# Patient Record
Sex: Female | Born: 1950 | Race: White | Hispanic: No | Marital: Married | Smoking: Never smoker
Health system: Southern US, Community
[De-identification: ages and names within clinical notes are randomized; demographics above are authoritative.]

## PROBLEM LIST (undated history)

## (undated) DIAGNOSIS — C801 Malignant (primary) neoplasm, unspecified: Secondary | ICD-10-CM

## (undated) DIAGNOSIS — M4802 Spinal stenosis, cervical region: Secondary | ICD-10-CM

## (undated) DIAGNOSIS — F419 Anxiety disorder, unspecified: Secondary | ICD-10-CM

## (undated) DIAGNOSIS — K219 Gastro-esophageal reflux disease without esophagitis: Secondary | ICD-10-CM

## (undated) DIAGNOSIS — E119 Type 2 diabetes mellitus without complications: Secondary | ICD-10-CM

## (undated) DIAGNOSIS — M1990 Unspecified osteoarthritis, unspecified site: Secondary | ICD-10-CM

## (undated) DIAGNOSIS — Z8489 Family history of other specified conditions: Secondary | ICD-10-CM

## (undated) DIAGNOSIS — Z9889 Other specified postprocedural states: Secondary | ICD-10-CM

## (undated) DIAGNOSIS — R112 Nausea with vomiting, unspecified: Secondary | ICD-10-CM

## (undated) DIAGNOSIS — K146 Glossodynia: Secondary | ICD-10-CM

## (undated) DIAGNOSIS — N9489 Other specified conditions associated with female genital organs and menstrual cycle: Secondary | ICD-10-CM

## (undated) DIAGNOSIS — F32A Depression, unspecified: Secondary | ICD-10-CM

## (undated) DIAGNOSIS — I10 Essential (primary) hypertension: Secondary | ICD-10-CM

## (undated) HISTORY — PX: TONSILLECTOMY: SUR1361

## (undated) HISTORY — PX: JOINT REPLACEMENT: SHX530

## (undated) HISTORY — PX: DILATION AND CURETTAGE OF UTERUS: SHX78

---
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
## 1991-12-18 HISTORY — PX: BREAST EXCISIONAL BIOPSY: SUR124

## 2000-12-17 HISTORY — PX: NECK SURGERY: SHX720

## 2004-05-04 ENCOUNTER — Other Ambulatory Visit: Payer: Self-pay

## 2004-12-19 ENCOUNTER — Ambulatory Visit: Payer: Self-pay | Admitting: Gastroenterology

## 2005-05-29 ENCOUNTER — Ambulatory Visit: Payer: Self-pay | Admitting: Psychiatry

## 2005-06-28 ENCOUNTER — Ambulatory Visit: Payer: Self-pay | Admitting: Internal Medicine

## 2006-06-11 ENCOUNTER — Ambulatory Visit: Payer: Self-pay | Admitting: Internal Medicine

## 2006-06-16 ENCOUNTER — Ambulatory Visit: Payer: Self-pay | Admitting: Internal Medicine

## 2006-07-11 ENCOUNTER — Ambulatory Visit: Payer: Self-pay | Admitting: Internal Medicine

## 2006-07-17 ENCOUNTER — Ambulatory Visit: Payer: Self-pay | Admitting: Internal Medicine

## 2007-07-29 ENCOUNTER — Ambulatory Visit: Payer: Self-pay | Admitting: Internal Medicine

## 2008-08-24 ENCOUNTER — Ambulatory Visit: Payer: Self-pay | Admitting: Internal Medicine

## 2009-08-31 ENCOUNTER — Ambulatory Visit: Payer: Self-pay | Admitting: Internal Medicine

## 2010-09-20 ENCOUNTER — Ambulatory Visit: Payer: Self-pay | Admitting: Internal Medicine

## 2010-12-17 HISTORY — PX: COLONOSCOPY: SHX174

## 2010-12-17 HISTORY — PX: CHOLECYSTECTOMY: SHX55

## 2011-03-27 ENCOUNTER — Ambulatory Visit: Payer: Self-pay | Admitting: Obstetrics and Gynecology

## 2011-03-27 DIAGNOSIS — I10 Essential (primary) hypertension: Secondary | ICD-10-CM

## 2011-03-30 ENCOUNTER — Ambulatory Visit: Payer: Self-pay | Admitting: Obstetrics and Gynecology

## 2011-04-02 LAB — PATHOLOGY REPORT

## 2011-04-11 ENCOUNTER — Ambulatory Visit: Payer: Self-pay | Admitting: Internal Medicine

## 2011-04-24 ENCOUNTER — Ambulatory Visit: Payer: Self-pay | Admitting: Gastroenterology

## 2011-05-07 ENCOUNTER — Ambulatory Visit: Payer: Self-pay | Admitting: Gastroenterology

## 2011-05-28 ENCOUNTER — Ambulatory Visit: Payer: Self-pay | Admitting: Gastroenterology

## 2011-06-07 ENCOUNTER — Ambulatory Visit: Payer: Self-pay | Admitting: Surgery

## 2011-06-14 ENCOUNTER — Ambulatory Visit: Payer: Self-pay | Admitting: Surgery

## 2012-01-01 ENCOUNTER — Ambulatory Visit: Payer: Self-pay | Admitting: Internal Medicine

## 2012-12-17 HISTORY — PX: BREAST BIOPSY: SHX20

## 2013-01-19 ENCOUNTER — Ambulatory Visit: Payer: Self-pay | Admitting: Internal Medicine

## 2013-01-27 ENCOUNTER — Ambulatory Visit: Payer: Self-pay | Admitting: Internal Medicine

## 2013-03-02 ENCOUNTER — Ambulatory Visit: Payer: Self-pay | Admitting: Surgery

## 2013-10-28 ENCOUNTER — Ambulatory Visit: Payer: Self-pay | Admitting: Surgery

## 2014-02-01 ENCOUNTER — Ambulatory Visit: Payer: Self-pay | Admitting: Surgery

## 2014-03-29 DIAGNOSIS — F418 Other specified anxiety disorders: Secondary | ICD-10-CM | POA: Insufficient documentation

## 2014-03-29 DIAGNOSIS — E6609 Other obesity due to excess calories: Secondary | ICD-10-CM | POA: Insufficient documentation

## 2014-03-29 DIAGNOSIS — I152 Hypertension secondary to endocrine disorders: Secondary | ICD-10-CM | POA: Insufficient documentation

## 2014-03-29 DIAGNOSIS — M4802 Spinal stenosis, cervical region: Secondary | ICD-10-CM | POA: Insufficient documentation

## 2014-03-29 DIAGNOSIS — E1159 Type 2 diabetes mellitus with other circulatory complications: Secondary | ICD-10-CM | POA: Insufficient documentation

## 2014-05-18 DIAGNOSIS — E1142 Type 2 diabetes mellitus with diabetic polyneuropathy: Secondary | ICD-10-CM | POA: Insufficient documentation

## 2015-11-30 ENCOUNTER — Other Ambulatory Visit: Payer: Self-pay | Admitting: Family Medicine

## 2015-11-30 DIAGNOSIS — Z0000 Encounter for general adult medical examination without abnormal findings: Secondary | ICD-10-CM

## 2016-01-18 ENCOUNTER — Ambulatory Visit
Admission: RE | Admit: 2016-01-18 | Discharge: 2016-01-18 | Disposition: A | Discharge status: Home / Self Care | Payer: BLUE CROSS/BLUE SHIELD | Source: Ambulatory Visit | Attending: Family Medicine | Admitting: Family Medicine

## 2016-01-18 DIAGNOSIS — Z1231 Encounter for screening mammogram for malignant neoplasm of breast: Secondary | ICD-10-CM | POA: Diagnosis present

## 2016-01-18 DIAGNOSIS — Z0000 Encounter for general adult medical examination without abnormal findings: Secondary | ICD-10-CM

## 2016-05-22 ENCOUNTER — Ambulatory Visit: Payer: BLUE CROSS/BLUE SHIELD | Admitting: Dietician

## 2016-06-15 ENCOUNTER — Other Ambulatory Visit: Payer: Self-pay | Admitting: Surgery

## 2016-06-15 DIAGNOSIS — N6002 Solitary cyst of left breast: Secondary | ICD-10-CM

## 2016-06-29 ENCOUNTER — Ambulatory Visit
Admission: RE | Admit: 2016-06-29 | Discharge: 2016-06-29 | Disposition: A | Discharge status: Home / Self Care | Payer: BLUE CROSS/BLUE SHIELD | Source: Ambulatory Visit | Attending: Surgery | Admitting: Surgery

## 2016-06-29 DIAGNOSIS — N6002 Solitary cyst of left breast: Secondary | ICD-10-CM | POA: Diagnosis present

## 2016-06-29 DIAGNOSIS — N63 Unspecified lump in breast: Secondary | ICD-10-CM | POA: Insufficient documentation

## 2016-07-05 ENCOUNTER — Other Ambulatory Visit: Payer: Self-pay | Admitting: Surgery

## 2016-07-05 DIAGNOSIS — N6320 Unspecified lump in the left breast, unspecified quadrant: Secondary | ICD-10-CM

## 2016-07-09 ENCOUNTER — Ambulatory Visit
Admission: RE | Admit: 2016-07-09 | Discharge: 2016-07-09 | Disposition: A | Discharge status: Home / Self Care | Payer: BLUE CROSS/BLUE SHIELD | Source: Ambulatory Visit | Attending: Surgery | Admitting: Surgery

## 2016-07-09 DIAGNOSIS — N63 Unspecified lump in breast: Secondary | ICD-10-CM | POA: Insufficient documentation

## 2016-07-09 DIAGNOSIS — D242 Benign neoplasm of left breast: Secondary | ICD-10-CM | POA: Insufficient documentation

## 2016-07-09 DIAGNOSIS — N6320 Unspecified lump in the left breast, unspecified quadrant: Secondary | ICD-10-CM

## 2016-07-09 HISTORY — PX: BREAST BIOPSY: SHX20

## 2016-07-10 LAB — SURGICAL PATHOLOGY

## 2016-07-12 ENCOUNTER — Other Ambulatory Visit: Payer: Self-pay | Admitting: Surgery

## 2016-07-12 DIAGNOSIS — N63 Unspecified lump in unspecified breast: Secondary | ICD-10-CM

## 2016-07-16 ENCOUNTER — Other Ambulatory Visit: Payer: Self-pay

## 2016-07-16 ENCOUNTER — Encounter
Admission: RE | Admit: 2016-07-16 | Discharge: 2016-07-16 | Disposition: A | Discharge status: Home / Self Care | Payer: BLUE CROSS/BLUE SHIELD | Source: Ambulatory Visit | Attending: Surgery | Admitting: Surgery

## 2016-07-16 DIAGNOSIS — Z01812 Encounter for preprocedural laboratory examination: Secondary | ICD-10-CM | POA: Diagnosis present

## 2016-07-16 DIAGNOSIS — Z01810 Encounter for preprocedural cardiovascular examination: Secondary | ICD-10-CM | POA: Insufficient documentation

## 2016-07-16 HISTORY — DX: Essential (primary) hypertension: I10

## 2016-07-16 HISTORY — DX: Nausea with vomiting, unspecified: R11.2

## 2016-07-16 HISTORY — DX: Type 2 diabetes mellitus without complications: E11.9

## 2016-07-16 HISTORY — DX: Anxiety disorder, unspecified: F41.9

## 2016-07-16 HISTORY — DX: Other specified postprocedural states: Z98.890

## 2016-07-16 HISTORY — DX: Family history of other specified conditions: Z84.89

## 2016-07-16 LAB — DIFFERENTIAL
BASOS ABS: 0 10*3/uL (ref 0–0.1)
BASOS PCT: 1 %
Eosinophils Absolute: 0.1 10*3/uL (ref 0–0.7)
Eosinophils Relative: 2 %
LYMPHS PCT: 28 %
Lymphs Abs: 1.9 10*3/uL (ref 1.0–3.6)
MONO ABS: 0.6 10*3/uL (ref 0.2–0.9)
Monocytes Relative: 9 %
NEUTROS ABS: 4.1 10*3/uL (ref 1.4–6.5)
NEUTROS PCT: 60 %

## 2016-07-16 LAB — CBC
HCT: 38.3 % (ref 35.0–47.0)
Hemoglobin: 13.4 g/dL (ref 12.0–16.0)
MCH: 30.5 pg (ref 26.0–34.0)
MCHC: 35 g/dL (ref 32.0–36.0)
MCV: 87 fL (ref 80.0–100.0)
PLATELETS: 297 10*3/uL (ref 150–440)
RBC: 4.4 MIL/uL (ref 3.80–5.20)
RDW: 13.8 % (ref 11.5–14.5)
WBC: 6.6 10*3/uL (ref 3.6–11.0)

## 2016-07-16 LAB — HEMOGLOBIN: HEMOGLOBIN: 13.5 g/dL (ref 12.0–16.0)

## 2016-07-16 NOTE — Patient Instructions (Signed)
Your procedure is scheduled on: Friday 07/20/16 Report to [facility].  Remember: Instructions that are not followed completely may result in serious medical risk, up to and including death, or upon the discretion of your surgeon and anesthesiologist your surgery may need to be rescheduled.    __X__ 1. Do not eat food or drink liquids after midnight. No gum chewing or hard candies.     __X__ 2. No Alcohol for 24 hours before or after surgery.   ____ 3. Bring all medications with you on the day of surgery if instructed.    __X__ 4. Notify your doctor if there is any change in your medical condition     (cold, fever, infections).     Do not wear jewelry, make-up, hairpins, clips or nail polish.  Do not wear lotions, powders, or perfumes.   Do not shave 48 hours prior to surgery. Men may shave face and neck.  Do not bring valuables to the hospital.    [facility] is not responsible for any belongings or valuables.               Contacts, dentures or bridgework may not be worn into surgery.  Leave your suitcase in the car. After surgery it may be brought to your room.  For patients admitted to the hospital, discharge time is determined by your                treatment team.   Patients discharged the day of surgery will not be allowed to drive home.   Please read over the following fact sheets that you were given:   Surgical Site Infection Prevention   __X__ Take these medicines the morning of surgery with A SIP OF WATER:    1. HYDRALAZINE  2. MAGNESIUM  3.   4.  5.  6.  ____ Fleet Enema (as directed)   __X__ Use CHG Soap as directed  ____ Use inhalers on the day of surgery  __X__ Stop metformin 2 days prior to surgery    __X__ Take 1/2 of usual insulin dose the night before surgery and none on the morning of surgery.   ____ Stop Coumadin/Plavix/aspirin on   __X__ Stop Anti-inflammatories on STOP ALEVE   __X__ Stop  supplements until after surgery. B12, FISH OIL   ____ Bring C-Pap to the hospital.

## 2016-07-17 NOTE — Pre-Procedure Instructions (Signed)
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

## 2016-07-20 ENCOUNTER — Ambulatory Visit
Admission: RE | Admit: 2016-07-20 | Discharge: 2016-07-20 | Disposition: A | Discharge status: Home / Self Care | Payer: BLUE CROSS/BLUE SHIELD | Source: Ambulatory Visit | Attending: Surgery | Admitting: Surgery

## 2016-07-20 ENCOUNTER — Encounter: Payer: Self-pay | Admitting: *Deleted

## 2016-07-20 ENCOUNTER — Ambulatory Visit: Payer: BLUE CROSS/BLUE SHIELD | Admitting: Certified Registered"

## 2016-07-20 ENCOUNTER — Encounter
Admission: RE | Disposition: A | Discharge status: Home / Self Care | Payer: Self-pay | Source: Ambulatory Visit | Attending: Surgery

## 2016-07-20 DIAGNOSIS — Z800 Family history of malignant neoplasm of digestive organs: Secondary | ICD-10-CM | POA: Diagnosis not present

## 2016-07-20 DIAGNOSIS — Z888 Allergy status to other drugs, medicaments and biological substances status: Secondary | ICD-10-CM | POA: Diagnosis not present

## 2016-07-20 DIAGNOSIS — Z9049 Acquired absence of other specified parts of digestive tract: Secondary | ICD-10-CM | POA: Diagnosis not present

## 2016-07-20 DIAGNOSIS — E119 Type 2 diabetes mellitus without complications: Secondary | ICD-10-CM | POA: Insufficient documentation

## 2016-07-20 DIAGNOSIS — D242 Benign neoplasm of left breast: Secondary | ICD-10-CM | POA: Diagnosis not present

## 2016-07-20 DIAGNOSIS — F418 Other specified anxiety disorders: Secondary | ICD-10-CM | POA: Insufficient documentation

## 2016-07-20 DIAGNOSIS — I10 Essential (primary) hypertension: Secondary | ICD-10-CM | POA: Insufficient documentation

## 2016-07-20 DIAGNOSIS — Z79899 Other long term (current) drug therapy: Secondary | ICD-10-CM | POA: Diagnosis not present

## 2016-07-20 DIAGNOSIS — Z881 Allergy status to other antibiotic agents status: Secondary | ICD-10-CM | POA: Insufficient documentation

## 2016-07-20 DIAGNOSIS — Z8249 Family history of ischemic heart disease and other diseases of the circulatory system: Secondary | ICD-10-CM | POA: Diagnosis not present

## 2016-07-20 DIAGNOSIS — Z833 Family history of diabetes mellitus: Secondary | ICD-10-CM | POA: Insufficient documentation

## 2016-07-20 DIAGNOSIS — E669 Obesity, unspecified: Secondary | ICD-10-CM | POA: Diagnosis not present

## 2016-07-20 DIAGNOSIS — N63 Unspecified lump in unspecified breast: Secondary | ICD-10-CM

## 2016-07-20 DIAGNOSIS — Z794 Long term (current) use of insulin: Secondary | ICD-10-CM | POA: Diagnosis not present

## 2016-07-20 DIAGNOSIS — Z9889 Other specified postprocedural states: Secondary | ICD-10-CM | POA: Diagnosis not present

## 2016-07-20 HISTORY — PX: BREAST LUMPECTOMY WITH NEEDLE LOCALIZATION: SHX5759

## 2016-07-20 HISTORY — PX: BREAST LUMPECTOMY: SHX2

## 2016-07-20 LAB — GLUCOSE, CAPILLARY
Glucose-Capillary: 146 mg/dL — ABNORMAL HIGH (ref 65–99)
Glucose-Capillary: 108 mg/dL — ABNORMAL HIGH (ref 65–99)

## 2016-07-20 SURGERY — BREAST LUMPECTOMY WITH NEEDLE LOCALIZATION
Anesthesia: General | Site: Breast | Laterality: Left | Wound class: Clean

## 2016-07-20 MED ORDER — ONDANSETRON HCL 4 MG/2ML IJ SOLN
INTRAMUSCULAR | Status: DC | PRN
  Administered 2016-07-20: 4 mg via INTRAVENOUS

## 2016-07-20 MED ORDER — ACETAMINOPHEN 10 MG/ML IV SOLN
INTRAVENOUS | Status: AC
  Filled 2016-07-20: qty 100

## 2016-07-20 MED ORDER — FAMOTIDINE 20 MG PO TABS
20.0000 mg | ORAL_TABLET | Freq: Once | ORAL | Status: AC
  Administered 2016-07-20: 20 mg via ORAL

## 2016-07-20 MED ORDER — FENTANYL CITRATE (PF) 100 MCG/2ML IJ SOLN
25.0000 ug | INTRAMUSCULAR | Status: DC | PRN
  Administered 2016-07-20 (×4): 25 ug via INTRAVENOUS

## 2016-07-20 MED ORDER — FAMOTIDINE 20 MG PO TABS
ORAL_TABLET | ORAL | Status: AC
  Filled 2016-07-20: qty 1

## 2016-07-20 MED ORDER — LIDOCAINE HCL (CARDIAC) 20 MG/ML IV SOLN
INTRAVENOUS | Status: DC | PRN
  Administered 2016-07-20: 80 mg via INTRAVENOUS

## 2016-07-20 MED ORDER — BUPIVACAINE-EPINEPHRINE (PF) 0.5% -1:200000 IJ SOLN
INTRAMUSCULAR | Status: AC
  Filled 2016-07-20: qty 30

## 2016-07-20 MED ORDER — SCOPOLAMINE 1 MG/3DAYS TD PT72
1.0000 | MEDICATED_PATCH | TRANSDERMAL | Status: DC
Start: 2016-07-20 — End: 2016-07-20
  Administered 2016-07-20: 1.5 mg via TRANSDERMAL

## 2016-07-20 MED ORDER — SODIUM CHLORIDE 0.9 % IV SOLN
INTRAVENOUS | Status: DC
  Administered 2016-07-20: 13:00:00 via INTRAVENOUS

## 2016-07-20 MED ORDER — ACETAMINOPHEN 10 MG/ML IV SOLN
INTRAVENOUS | Status: DC | PRN
  Administered 2016-07-20: 1000 mg via INTRAVENOUS

## 2016-07-20 MED ORDER — BUPIVACAINE-EPINEPHRINE (PF) 0.5% -1:200000 IJ SOLN
INTRAMUSCULAR | Status: DC | PRN
  Administered 2016-07-20: 8 mL via PERINEURAL

## 2016-07-20 MED ORDER — MIDAZOLAM HCL 2 MG/2ML IJ SOLN
INTRAMUSCULAR | Status: DC | PRN
  Administered 2016-07-20: 2 mg via INTRAVENOUS

## 2016-07-20 MED ORDER — SCOPOLAMINE 1 MG/3DAYS TD PT72
MEDICATED_PATCH | TRANSDERMAL | Status: AC
  Filled 2016-07-20: qty 1

## 2016-07-20 MED ORDER — FENTANYL CITRATE (PF) 100 MCG/2ML IJ SOLN
INTRAMUSCULAR | Status: AC
  Filled 2016-07-20: qty 2

## 2016-07-20 MED ORDER — FENTANYL CITRATE (PF) 100 MCG/2ML IJ SOLN
INTRAMUSCULAR | Status: DC | PRN
Start: 2016-07-20 — End: 2016-07-20
  Administered 2016-07-20 (×2): 50 ug via INTRAVENOUS

## 2016-07-20 MED ORDER — HYDROCODONE-ACETAMINOPHEN 5-325 MG PO TABS: 1.0000 | ORAL_TABLET | ORAL | Status: DC | PRN

## 2016-07-20 MED ORDER — PROPOFOL 10 MG/ML IV BOLUS
INTRAVENOUS | Status: DC | PRN
  Administered 2016-07-20: 150 mg via INTRAVENOUS

## 2016-07-20 MED ORDER — HYDROCODONE-ACETAMINOPHEN 5-325 MG PO TABS: 1.0000 | ORAL_TABLET | ORAL | 0 refills | Status: DC | PRN

## 2016-07-20 MED ORDER — ONDANSETRON HCL 4 MG/2ML IJ SOLN: 4.0000 mg | Freq: Once | INTRAMUSCULAR | Status: DC | PRN

## 2016-07-20 SURGICAL SUPPLY — 29 items
BLADE SURG 15 STRL LF DISP TIS (BLADE) ×1 IMPLANT
BLADE SURG 15 STRL SS (BLADE) ×2
CANISTER SUCT 1200ML W/VALVE (MISCELLANEOUS) ×3 IMPLANT
CHLORAPREP W/TINT 26ML (MISCELLANEOUS) ×3 IMPLANT
CNTNR SPEC 2.5X3XGRAD LEK (MISCELLANEOUS) ×1
CONT SPEC 4OZ STER OR WHT (MISCELLANEOUS) ×2
CONTAINER SPEC 2.5X3XGRAD LEK (MISCELLANEOUS) ×1 IMPLANT
DEVICE DUBIN SPECIMEN MAMMOGRA (MISCELLANEOUS) ×3 IMPLANT
DRAPE LAPAROTOMY 77X122 PED (DRAPES) ×3 IMPLANT
ELECT REM PT RETURN 9FT ADLT (ELECTROSURGICAL) ×3
ELECTRODE REM PT RTRN 9FT ADLT (ELECTROSURGICAL) ×1 IMPLANT
GLOVE BIO SURGEON STRL SZ7.5 (GLOVE) ×3 IMPLANT
GOWN STRL REUS W/ TWL LRG LVL3 (GOWN DISPOSABLE) ×2 IMPLANT
GOWN STRL REUS W/TWL LRG LVL3 (GOWN DISPOSABLE) ×4
KIT RM TURNOVER STRD PROC AR (KITS) ×3 IMPLANT
LABEL OR SOLS (LABEL) ×3 IMPLANT
LIQUID BAND (GAUZE/BANDAGES/DRESSINGS) ×3 IMPLANT
MARGIN MAP 10MM (MISCELLANEOUS) ×3 IMPLANT
NEEDLE HYPO 25X1 1.5 SAFETY (NEEDLE) ×3 IMPLANT
PACK BASIN MINOR ARMC (MISCELLANEOUS) ×3 IMPLANT
SUT CHROMIC 3 0 SH 27 (SUTURE) ×3 IMPLANT
SUT CHROMIC 4 0 RB 1X27 (SUTURE) ×3 IMPLANT
SUT ETHILON 3-0 FS-10 30 BLK (SUTURE) ×3
SUT MNCRL 4-0 (SUTURE) ×2
SUT MNCRL 4-0 27XMFL (SUTURE) ×1
SUTURE EHLN 3-0 FS-10 30 BLK (SUTURE) ×1 IMPLANT
SUTURE MNCRL 4-0 27XMF (SUTURE) ×1 IMPLANT
SYRINGE 10CC LL (SYRINGE) ×3 IMPLANT
WATER STERILE IRR 1000ML POUR (IV SOLUTION) ×3 IMPLANT

## 2016-07-20 NOTE — Op Note (Signed)
OPERATIVE REPORT  PREOPERATIVE  DIAGNOSIS: . Left breast mass  POSTOPERATIVE DIAGNOSIS: . Left breast mass  PROCEDURE: . Excision left breast mass  ANESTHESIA:  General  SURGEON: Rochel Brome  MD   INDICATIONS: . She has a history of left nipple discharge. She had ultrasound findings of a mass at the 2:00 position posterior to the areola. Needle biopsy demonstrated intraductal papilloma. Surgery was recommended for definitive treatment. She did have insertion of a Kopan's wire. The follow-up mammogram demonstrates the proximity of the wire with the biopsy marker.  With the patient on the operating table in the supine position under general anesthesia the dressing was removed from the left breast. The Kopan's wire entered the breast at approximately the 3:00 position. The wire was cut 2 cm from the skin. The breast was prepared with ChloraPrep and draped in a sterile manner.  The nipple was squeezed and caused a drop of black fluid to come out. A lacrimal duct probe was inserted into that duct and went in easily approximate 4 cm. The  probe was left and during the course of the excision. A curvilinear incision was made at the lateral border of the areola from approximately 2:00 to 4:00 position. The dissection was carried down through subcutaneous tissues using electrocautery for hemostasis. Dissection was carried down to encounter the Kopan's wire which was delivered up into the wound. Also dissection was carried down to the lacrimal duct probe. A portion of tissue containing the lacrimal duct probe and the Kopan's wire was excised this was approximately 2 x 2 by 4 cm in dimension. This was labeled with margin maps to mark the cranial caudal medial lateral and deep margins. The specimen was submitted for specimen mammogram and routine pathology. The radiologist did call to report that the biopsy marker was seen within the specimen mammogram.  The wound was inspected and several tiny bleeding  points are cauterized hemostasis was subsequently intact. 3-0 chromic was used to place a pursestring suture which is posterior to the nipple to help prevent inversion. The subcutaneous tissues were infiltrated with half percent Sensorcaine with epinephrine. The superficial fascia was closed with interrupted 3-0 chromic. The skin was closed with running 4-0 Monocryl subcuticular suture and LiquiBand.  The patient tolerated surgery satisfactorily and was then prepared for transfer to the recovery room  [facility].D.

## 2016-07-20 NOTE — H&P (Signed)
  After office visit she did have ultrasound-guided core needle biopsy of the left breast mass. demonstrating intraductal papilloma. Excision of the mass was recommended to do under general anesthesia. She has had preoperative x-ray needle localization.  She reports no change in overall condition since the day of the office exam.  I discussed the plan for excision of left breast mass.

## 2016-07-20 NOTE — Anesthesia Preprocedure Evaluation (Signed)
Anesthesia Evaluation  Patient identified by MRN, date of birth, ID band Patient confused    Reviewed: Allergy & Precautions, NPO status , Patient's Chart, lab work & pertinent test results  Airway Mallampati: III       Dental  (+) Teeth Intact   Pulmonary    breath sounds clear to auscultation       Cardiovascular hypertension, Pt. on medications  Rhythm:Regular     Neuro/Psych    GI/Hepatic   Endo/Other  diabetes, Type 2, Oral Hypoglycemic AgentsMorbid obesity  Renal/GU      Musculoskeletal   Abdominal (+) + obese,   Peds  Hematology   Anesthesia Other Findings   Reproductive/Obstetrics                             Anesthesia Physical Anesthesia Plan  ASA: III  Anesthesia Plan: General   Post-op Pain Management:    Induction: Intravenous  Airway Management Planned: LMA  Additional Equipment:   Intra-op Plan:   Post-operative Plan: Extubation in OR  Informed Consent: I have reviewed the patients History and Physical, chart, labs and discussed the procedure including the risks, benefits and alternatives for the proposed anesthesia with the patient or authorized representative who has indicated his/her understanding and acceptance.     Plan Discussed with: CRNA  Anesthesia Plan Comments:         Anesthesia Quick Evaluation

## 2016-07-20 NOTE — Anesthesia Procedure Notes (Signed)
Procedure Name: LMA Insertion Performed by: Ligia Duguay Pre-anesthesia Checklist: Patient identified, Patient being monitored, Timeout performed, Emergency Drugs available and Suction available Patient Re-evaluated:Patient Re-evaluated prior to inductionOxygen Delivery Method: Circle system utilized Preoxygenation: Pre-oxygenation with 100% oxygen Intubation Type: IV induction LMA: LMA inserted LMA Size: 4.0 Tube type: Oral Number of attempts: 1 Placement Confirmation: positive ETCO2 and breath sounds checked- equal and bilateral Tube secured with: Tape Dental Injury: Teeth and Oropharynx as per pre-operative assessment        

## 2016-07-20 NOTE — Discharge Instructions (Addendum)
AMBULATORY SURGERY  DISCHARGE INSTRUCTIONS   1) The drugs that you were given will stay in your system until tomorrow so for the next 24 hours you should not:  A) Drive an automobile B) Make any legal decisions C) Drink any alcoholic beverage   2) You may resume regular meals tomorrow.  Today it is better to start with liquids and gradually work up to solid foods.  You may eat anything you prefer, but it is better to start with liquids, then soup and crackers, and gradually work up to solid foods.   3) Please notify your doctor immediately if you have any unusual bleeding, trouble breathing, redness and pain at the surgery site, drainage, fever, or pain not relieved by medication.    4) Additional Instructions:        Please contact your physician with any problems or Same Day Surgery at [phone], Monday through Friday 6 am to 4 pm, or  at [facility] number at [phone].Take Tylenol or Norco if needed for pain.  Remove dressings on Saturday. May shower Sunday.  Avoid straining and heavy lifting for the first week.  May return to work when ready.

## 2016-07-20 NOTE — Transfer of Care (Signed)
Immediate Anesthesia Transfer of Care Note  Patient: Julia Cooper  Procedure(s) Performed: Procedure(s): BREAST LUMPECTOMY WITH NEEDLE LOCALIZATION (Left)  Patient Location: PACU  Anesthesia Type:General  Level of Consciousness: sedated  Airway & Oxygen Therapy: Patient Spontanous Breathing and Patient connected to face mask oxygen  Post-op Assessment: Report given to RN and Post -op Vital signs reviewed and stable  Post vital signs: Reviewed and stable  Last Vitals:  Vitals:   07/20/16 1202 07/20/16 1413  BP: (!) 149/92 (!) 159/94  Pulse: 89 71  Resp: 16 11  Temp: 36.6 C     Last Pain:  Vitals:   07/20/16 1202  TempSrc: Oral         Complications: No apparent anesthesia complications

## 2016-07-20 NOTE — Anesthesia Postprocedure Evaluation (Signed)
Anesthesia Post Note  Patient: Julia Cooper  Procedure(s) Performed: Procedure(s) (LRB): BREAST LUMPECTOMY WITH NEEDLE LOCALIZATION (Left)  Patient location during evaluation: PACU Anesthesia Type: General Level of consciousness: awake Pain management: pain level controlled Vital Signs Assessment: post-procedure vital signs reviewed and stable Respiratory status: spontaneous breathing Cardiovascular status: stable Anesthetic complications: no    Last Vitals:  Vitals:   07/20/16 1202 07/20/16 1413  BP: (!) 149/92 (!) 159/94  Pulse: 89 71  Resp: 16 11  Temp: 36.6 C (!) 35.8 C    Last Pain:  Vitals:   07/20/16 1413  TempSrc:   PainSc: Asleep                 VAN STAVEREN,Janeil Schexnayder

## 2016-07-23 ENCOUNTER — Encounter: Payer: Self-pay | Admitting: Surgery

## 2016-07-23 LAB — SURGICAL PATHOLOGY

## 2017-01-11 ENCOUNTER — Other Ambulatory Visit: Payer: Self-pay | Admitting: Family Medicine

## 2017-01-11 DIAGNOSIS — Z1231 Encounter for screening mammogram for malignant neoplasm of breast: Secondary | ICD-10-CM

## 2017-02-07 ENCOUNTER — Ambulatory Visit
Admission: RE | Admit: 2017-02-07 | Discharge: 2017-02-07 | Disposition: A | Discharge status: Home / Self Care | Payer: BLUE CROSS/BLUE SHIELD | Source: Ambulatory Visit | Attending: Family Medicine | Admitting: Family Medicine

## 2017-02-07 DIAGNOSIS — Z1231 Encounter for screening mammogram for malignant neoplasm of breast: Secondary | ICD-10-CM | POA: Insufficient documentation

## 2017-02-11 ENCOUNTER — Ambulatory Visit: Payer: BLUE CROSS/BLUE SHIELD

## 2017-11-11 IMAGING — MG 2D DIGITAL DIAGNOSTIC UNILATERAL LEFT MAMMOGRAM WITH CAD AND ADJ
6 of 9 series · 6 of 21 positions shown · non-contrast
Comparison: Previous exam(s).

CLINICAL DATA: 64-year-old female presenting with 2 episodes of
significant spontaneous left nipple discharge, the first occurring
on [REDACTED] and a second occurring at the end [DATE]. Discharge
has been brown in color.

EXAM:
2D DIGITAL DIAGNOSTIC UNILATERAL LEFT MAMMOGRAM WITH CAD AND ADJUNCT
TOMO
LEFT BREAST ULTRASOUND

[L MLO (1 of 2)]
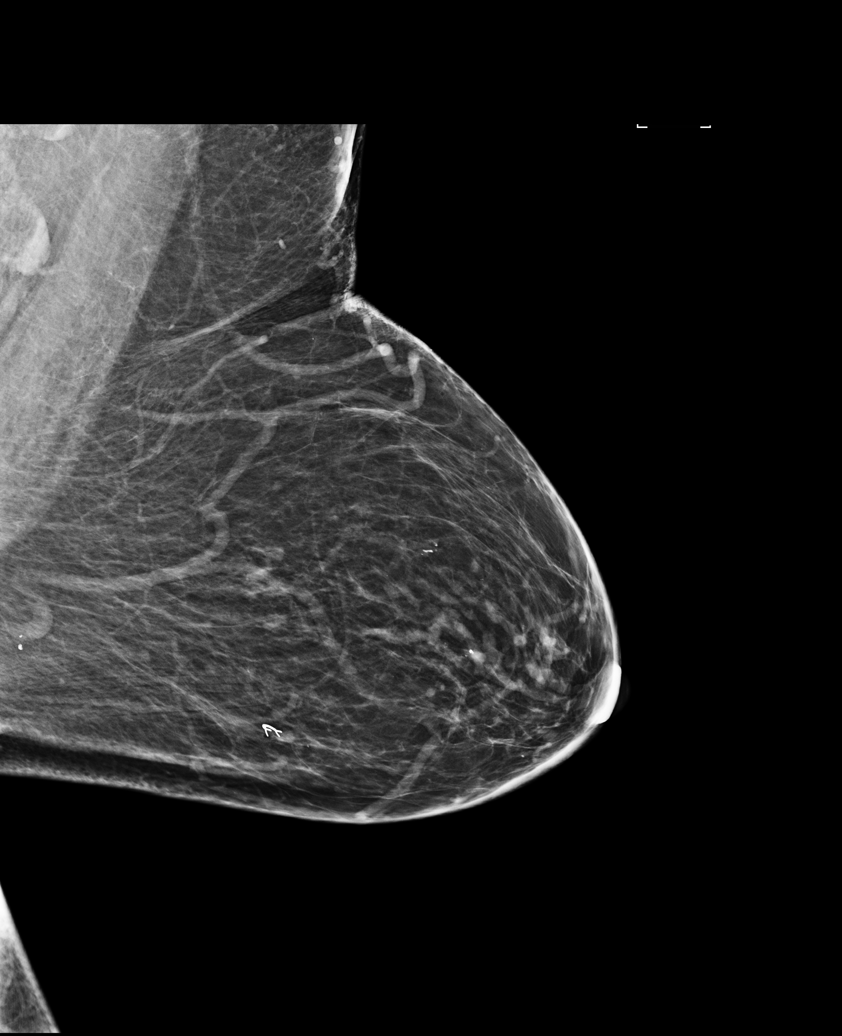

[L CC synth-2D]
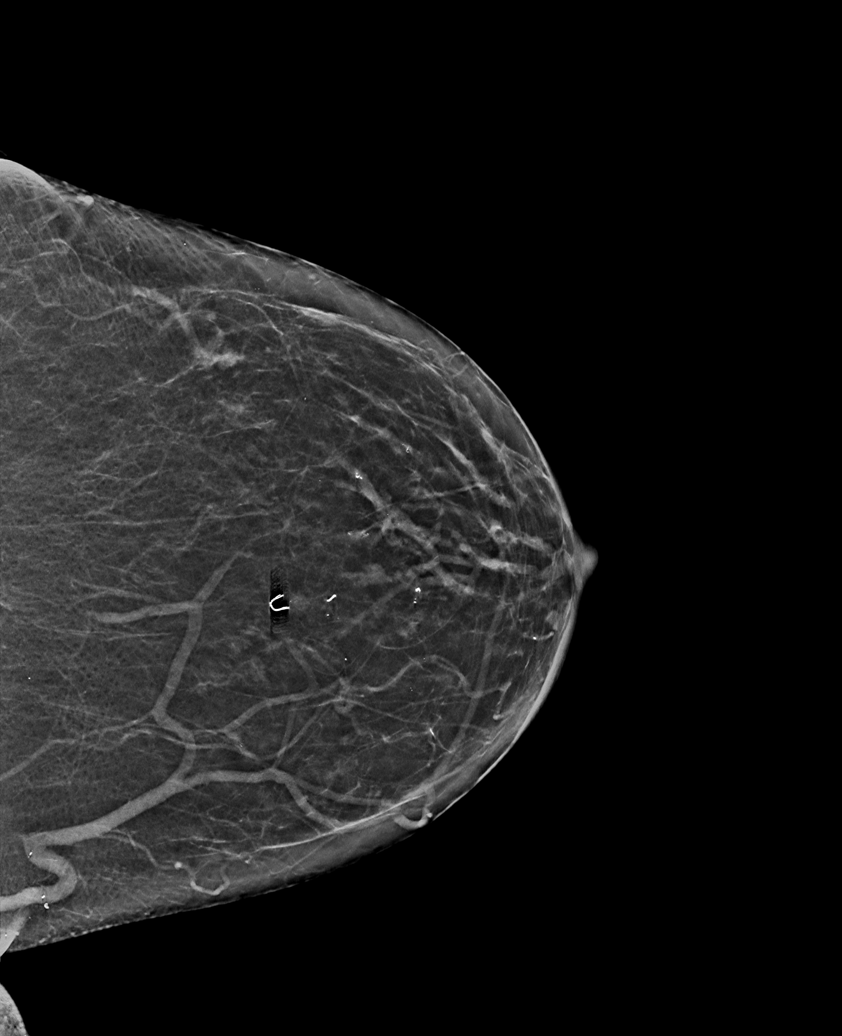

[L MLO synth-2D (1 of 2)]
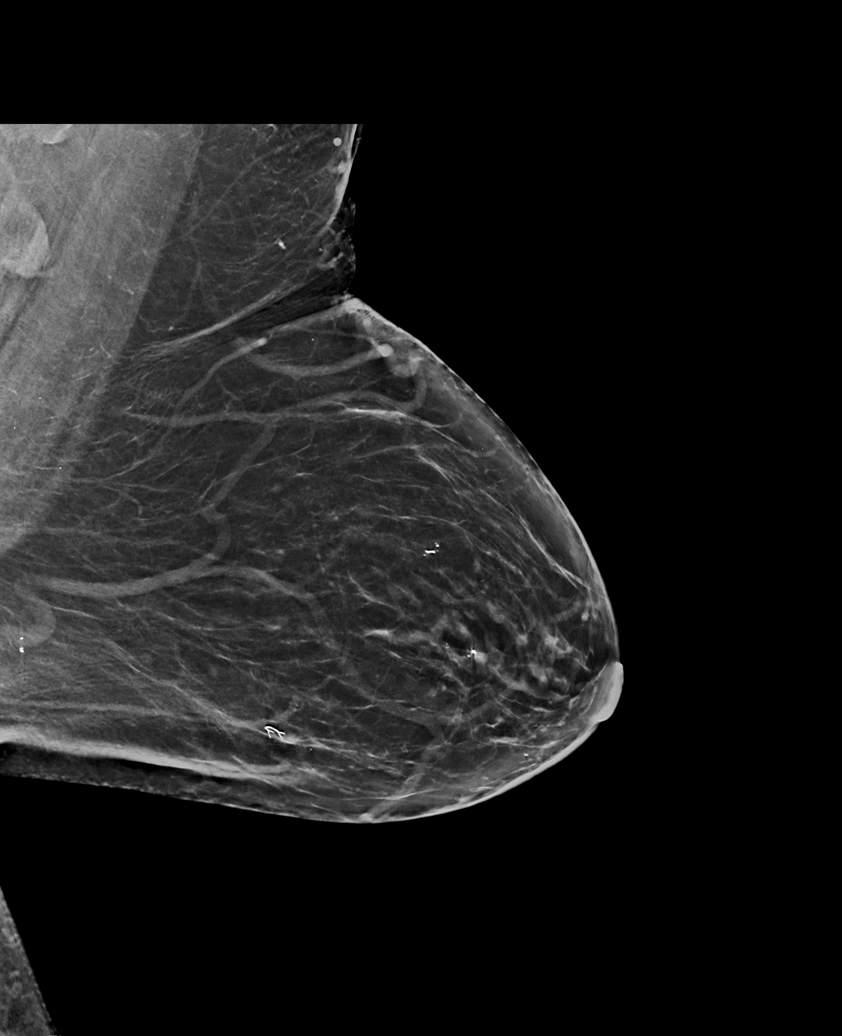

[L MLO (2 of 2)]
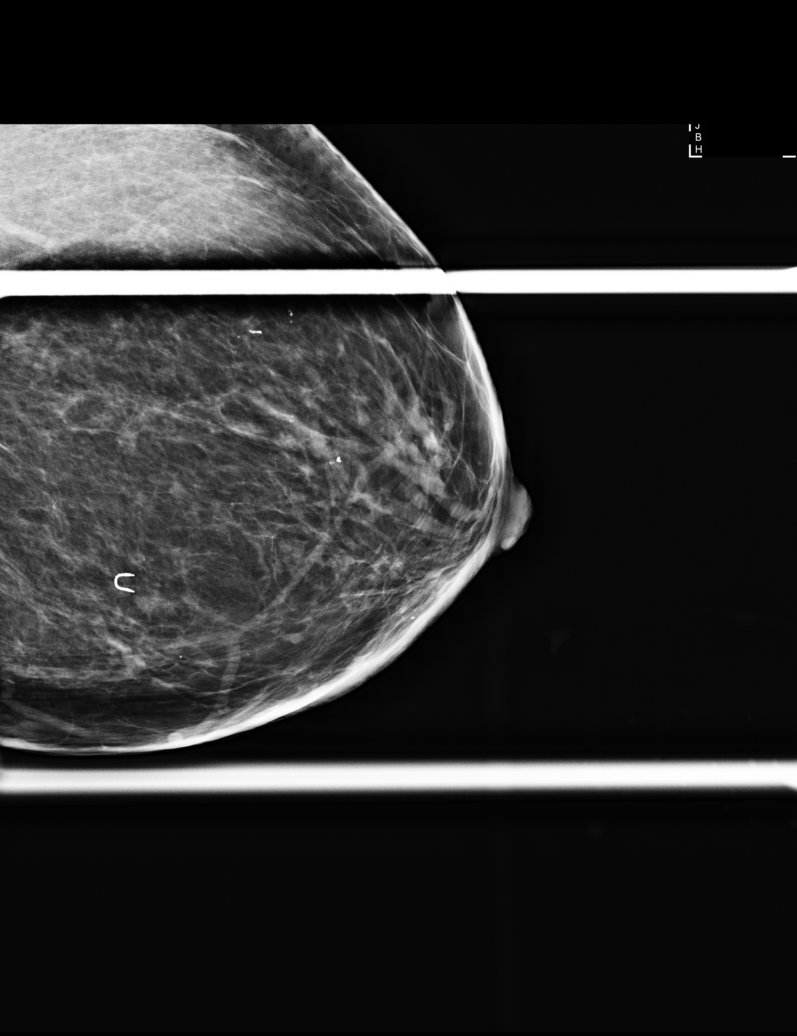

[L MLO synth-2D (2 of 2)]
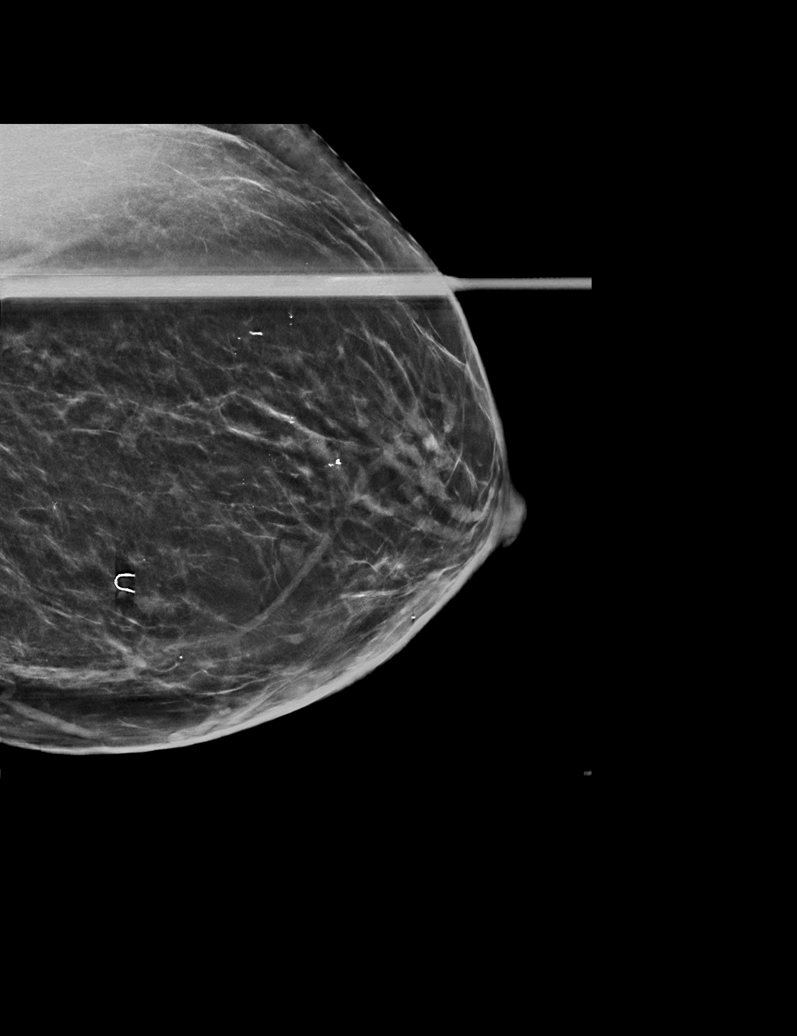

[L CC]
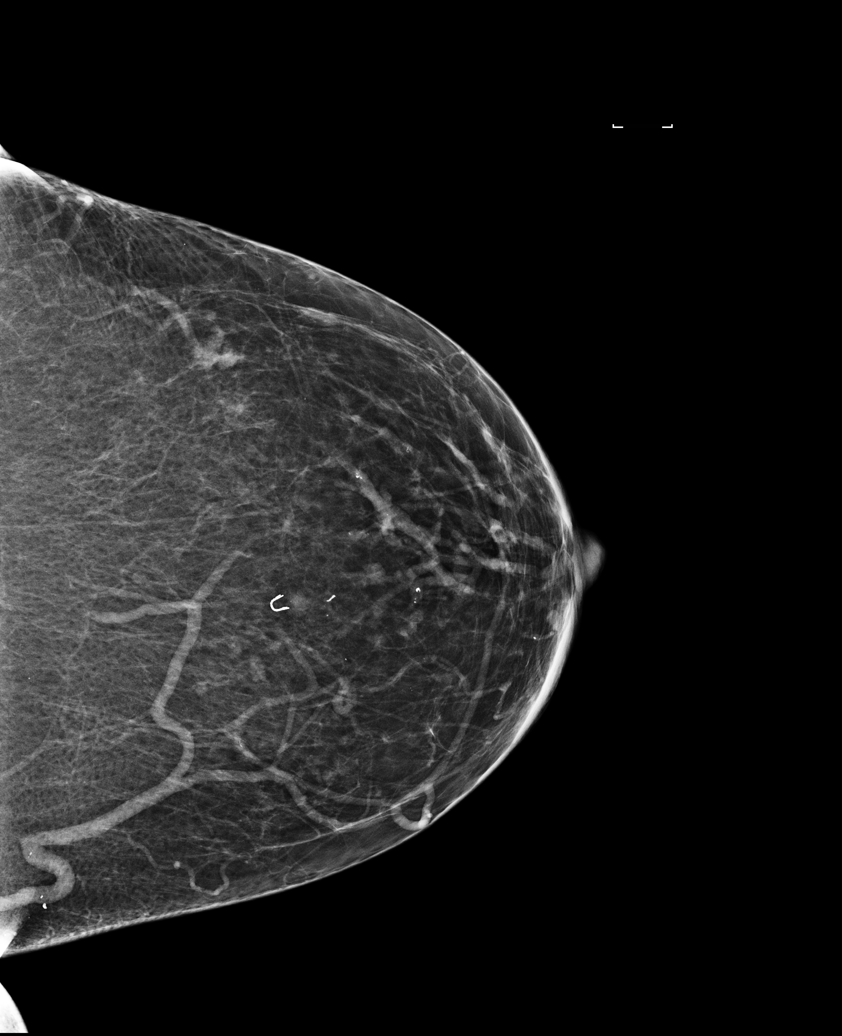

[6 of 21 positions shown; findings below may reference images not displayed]

ACR Breast Density Category b: There are scattered areas of
fibroglandular density.
FINDINGS: No suspicious calcifications, masses or areas of distortion are seen
in the bilateral breasts.

Mammographic images were processed with CAD.

Physical exam of the left breast demonstrates no discrete palpable
masses. They there is a slightly purplish discolored duct in the
central to slightly upper outer left breast. The patient states that
this is where the discharge had com fro[REDACTED] colored discharge
was elicited with compression by the mammogram.

Ultrasound of the left breast demonstrates multiple mildly dilated
ducts in the upper-outer quadrant. Within 1 of these depth at 2
o'clock, 1 cm from the nipple there is an isoechoic mass measuring
1.6 x 0.5 x 0.3 cm. No blood flow was identified within the mass on
color Doppler imaging. Ultrasound of the left axilla demonstrates
multiple normal-appearing lymph nodes.
IMPRESSION: 1. There is a intraductal mass in the left breast at 2 o'clock,
which may account for the patient's symptoms.

2.  No evidence of left axillary lymphadenopathy.

RECOMMENDATION:
1. Ultrasound-guided biopsy is recommended for the intraductal left
breast mass at 2 o'clock.

I have discussed the findings and recommendations with the patient.
Results were also provided in writing at the conclusion of the
visit. If applicable, a reminder letter will be sent to the patient
regarding the next appointment.

BI-RADS CATEGORY  4: Suspicious.

## 2017-12-09 ENCOUNTER — Emergency Department
Admission: EM | Admit: 2017-12-09 | Discharge: 2017-12-09 | Disposition: A | Discharge status: Home / Self Care | Payer: BLUE CROSS/BLUE SHIELD | Attending: Emergency Medicine | Admitting: Emergency Medicine

## 2017-12-09 ENCOUNTER — Encounter: Payer: Self-pay | Admitting: Emergency Medicine

## 2017-12-09 DIAGNOSIS — R51 Headache: Secondary | ICD-10-CM | POA: Insufficient documentation

## 2017-12-09 DIAGNOSIS — Z79899 Other long term (current) drug therapy: Secondary | ICD-10-CM | POA: Insufficient documentation

## 2017-12-09 DIAGNOSIS — E119 Type 2 diabetes mellitus without complications: Secondary | ICD-10-CM | POA: Diagnosis not present

## 2017-12-09 DIAGNOSIS — Z794 Long term (current) use of insulin: Secondary | ICD-10-CM | POA: Diagnosis not present

## 2017-12-09 DIAGNOSIS — I10 Essential (primary) hypertension: Secondary | ICD-10-CM | POA: Insufficient documentation

## 2017-12-09 LAB — CBC WITH DIFFERENTIAL/PLATELET
Basophils Absolute: 0 10*3/uL (ref 0–0.1)
Basophils Relative: 1 %
Eosinophils Absolute: 0.1 10*3/uL (ref 0–0.7)
Eosinophils Relative: 1 %
HCT: 39.8 % (ref 35.0–47.0)
Hemoglobin: 13.5 g/dL (ref 12.0–16.0)
Lymphocytes Relative: 23 %
Lymphs Abs: 1.7 10*3/uL (ref 1.0–3.6)
MCH: 30 pg (ref 26.0–34.0)
MCHC: 33.9 g/dL (ref 32.0–36.0)
MCV: 88.7 fL (ref 80.0–100.0)
Monocytes Absolute: 0.5 10*3/uL (ref 0.2–0.9)
Monocytes Relative: 7 %
Neutro Abs: 5 10*3/uL (ref 1.4–6.5)
Neutrophils Relative %: 68 %
Platelets: 297 10*3/uL (ref 150–440)
RBC: 4.49 MIL/uL (ref 3.80–5.20)
RDW: 13.9 % (ref 11.5–14.5)
WBC: 7.3 10*3/uL (ref 3.6–11.0)

## 2017-12-09 LAB — COMPREHENSIVE METABOLIC PANEL
ALT: 25 U/L (ref 14–54)
AST: 24 U/L (ref 15–41)
Albumin: 4 g/dL (ref 3.5–5.0)
Alkaline Phosphatase: 70 U/L (ref 38–126)
Anion gap: 11 (ref 5–15)
BUN: 12 mg/dL (ref 6–20)
CO2: 25 mmol/L (ref 22–32)
Calcium: 8.6 mg/dL — ABNORMAL LOW (ref 8.9–10.3)
Chloride: 93 mmol/L — ABNORMAL LOW (ref 101–111)
Creatinine, Ser: 0.63 mg/dL (ref 0.44–1.00)
GFR calc Af Amer: >60 mL/min (ref >60)
GFR calc non Af Amer: >60 mL/min (ref >60)
Glucose, Bld: 197 mg/dL — ABNORMAL HIGH (ref 65–99)
Potassium: 3.1 mmol/L — ABNORMAL LOW (ref 3.5–5.1)
Sodium: 129 mmol/L — ABNORMAL LOW (ref 135–145)
Total Bilirubin: 1.2 mg/dL (ref 0.3–1.2)
Total Protein: 6.8 g/dL (ref 6.5–8.1)

## 2017-12-09 LAB — URINALYSIS, COMPLETE (UACMP) WITH MICROSCOPIC
Bacteria, UA: NONE SEEN
Bilirubin Urine: NEGATIVE
Glucose, UA: NEGATIVE mg/dL
Hgb urine dipstick: NEGATIVE
Ketones, ur: NEGATIVE mg/dL
Leukocytes, UA: NEGATIVE
Nitrite: NEGATIVE
Protein, ur: NEGATIVE mg/dL
Specific Gravity, Urine: 1.004 — ABNORMAL LOW (ref 1.005–1.030)
pH: 6 (ref 5.0–8.0)

## 2017-12-09 MED ORDER — PROMETHAZINE HCL 25 MG/ML IJ SOLN
12.5000 mg | Freq: Once | INTRAMUSCULAR | Status: AC
  Administered 2017-12-09: 12.5 mg via INTRAVENOUS
  Filled 2017-12-09: qty 1

## 2017-12-09 MED ORDER — SODIUM CHLORIDE 0.9 % IV BOLUS (SEPSIS)
500.0000 mL | Freq: Once | INTRAVENOUS | Status: AC
  Administered 2017-12-09: 500 mL via INTRAVENOUS

## 2017-12-09 NOTE — ED Triage Notes (Signed)
First Nurse Note:  Arrives with C/O hypertension x 2-3 days.  Symptoms include headache.  Patient is AAOx3.  Skin warm and dry.  MAE equally and strong.  Ambulates with easy and steady gait. NAD

## 2017-12-09 NOTE — ED Provider Notes (Signed)
[facility] Emergency Department Provider Note  ____________________________________________  Time seen: Approximately 4:52 PM  I have reviewed the triage vital signs and the nursing notes.   HISTORY  Chief Complaint Hypertension    HPI Julia Cooper is a 66 y.o. female with a history of diabetes and hypertension, presents to the emergency department with concern for worsening hypertension, headache and nausea.  Patient reports that she "does not feel good".  She denies rhinorrhea, congestion, nonproductive cough and fever.  She has no known sick contacts.  She has not missed her medications for hypertension, which include losartan and hydralazine.  Patient denies chest pain, chest tightness and abdominal pain.  She has no history of renal artery stenosis or aneurysms.  Patient reports that she has been restricting her salt intake in an attempt to manage her hypertension.  Patient reports that no adjustments have been made in her pharmacologic regimen for hypertension in the past 2 years.  Patient describes her headache as occipital in nature.  She denies blurry vision or weakness in the upper or lower extremities.  Patient's husband has noticed no changes in speech or behavior.   Past Medical History:  Diagnosis Date  . Anxiety   . Diabetes mellitus without complication ([place])   . Family history of adverse reaction to anesthesia    father  N/V  . Hypertension   . PONV (postoperative nausea and vomiting)     There are no active problems to display for this patient.   Past Surgical History:  Procedure Laterality Date  . BREAST BIOPSY Left 1993   neg  . BREAST BIOPSY Left 2014   core bx- neg  . BREAST BIOPSY Left 07/09/2016   path pending  . BREAST LUMPECTOMY WITH NEEDLE LOCALIZATION Left 07/20/2016   Procedure: BREAST LUMPECTOMY WITH NEEDLE LOCALIZATION;  Surgeon: Leonie Green, MD;  Location: [facility] ORS;  Service: General;  Laterality: Left;  .  CHOLECYSTECTOMY  2012  . DILATION AND CURETTAGE OF UTERUS    . NECK SURGERY  2002   titanium plate screws and donor bone  . NOSE SURGERY  1973  . TONSILLECTOMY     age 39    Prior to Admission medications   Medication Sig Start Date End Date Taking? Authorizing Provider  Cholecalciferol (VITAMIN D3) 2000 units TABS Take 1 tablet by mouth daily. After lunch    [provider]  hydrALAZINE (APRESOLINE) 25 MG tablet Take 25 mg by mouth 3 (three) times daily.    [provider]  HYDROcodone-acetaminophen (NORCO) 5-325 MG tablet Take 1-2 tablets by mouth every 4 (four) hours as needed for moderate pain. 07/20/16   Leonie Green, MD  insulin NPH Human (HUMULIN N,NOVOLIN N) 100 UNIT/ML injection Inject 32 Units into the skin at bedtime.    [provider]  Liraglutide (VICTOZA) 18 MG/3ML SOPN Inject 1.2 mg into the skin daily.    [provider]  losartan-hydrochlorothiazide (HYZAAR) 100-25 MG tablet Take 1 tablet by mouth daily.    [provider]  magnesium oxide (MAG-OX) 400 MG tablet Take 400 mg by mouth daily. After Lunch    [provider]  metFORMIN (GLUMETZA) 1000 MG (MOD) 24 hr tablet Take 1,000 mg by mouth 2 (two) times daily with a meal.    [provider]  Methylcobalamin (B-12) 5000 MCG TBDP Take 1 tablet by mouth daily. After Lunch    [provider]  Multiple Vitamins-Minerals (MULTIVITAMIN WITH MINERALS) tablet Take 1 tablet  by mouth daily. After Lunch    [provider]  Omega-3 Fatty Acids (FISH OIL) 1200 MG CAPS Take 2 capsules by mouth daily. After Lunch    [provider]  potassium chloride SA (K-DUR,KLOR-CON) 20 MEQ tablet Take 20 mEq by mouth every evening.    [provider]    Allergies Ace inhibitors; Amlodipine; Ciprofloxacin; and Doxazosin  No family history on file.  Social History Social History   Tobacco Use  . Smoking status: Never Smoker  . Smokeless  tobacco: Never Used  Substance Use Topics  . Alcohol use: No  . Drug use: No     Review of Systems  Constitutional: No fever/chills Eyes: No visual changes. No discharge ENT: No upper respiratory complaints. Cardiovascular: no chest pain. Respiratory: no cough. No SOB. Gastrointestinal: No abdominal pain. Patient has nausea. No vomiting.  No diarrhea.  No constipation. Genitourinary: Negative for dysuria. No hematuria Musculoskeletal: Negative for musculoskeletal pain. Skin: Negative for rash, abrasions, lacerations, ecchymosis. Neurological: Patient has headache, no focal weakness or numbness.  ____________________________________________   PHYSICAL EXAM:  VITAL SIGNS: ED Triage Vitals  Enc Vitals Group     BP 12/09/17 1431 (!) 167/90     Pulse Rate 12/09/17 1429 90     Resp 12/09/17 1429 15     Temp 12/09/17 1429 97.6 F (36.4 C)     Temp Source 12/09/17 1429 Oral     SpO2 12/09/17 1429 99 %     Weight 12/09/17 1430 220 lb (99.8 kg)     Height 12/09/17 1430 5\' 6"  (1.676 m)     Head Circumference --      Peak Flow --      Pain Score 12/09/17 1428 6     Pain Loc --      Pain Edu? --      Excl. in [place]? --      Constitutional: Alert and oriented. Well appearing and in no acute distress. Eyes: Conjunctivae are normal. PERRL. EOMI. Head: Atraumatic. ENT:      Ears: TMs are pearly bilaterally.       Nose: No congestion/rhinnorhea.      Mouth/Throat: Mucous membranes are moist.  Neck: No stridor.  FROM.  Cardiovascular: Normal rate, regular rhythm. Normal S1 and S2.  Good peripheral circulation. Respiratory: Normal respiratory effort without tachypnea or retractions. Lungs CTAB. Good air entry to the bases with no decreased or absent breath sounds. Gastrointestinal: Bowel sounds 4 quadrants. Soft and nontender to palpation. No guarding or rigidity. No palpable masses. No distention. No CVA tenderness. Musculoskeletal: Full range of motion to all extremities. No gross  deformities appreciated. Neurologic:  Normal speech and language. No gross focal neurologic deficits are appreciated.  Skin:  Skin is warm, dry and intact. No rash noted. Psychiatric: Mood and affect are normal. Speech and behavior are normal. Patient exhibits appropriate insight and judgement.   ____________________________________________   LABS (all labs ordered are listed, but only abnormal results are displayed)  Labs Reviewed  COMPREHENSIVE METABOLIC PANEL - Abnormal; Notable for the following components:      Result Value   Sodium 129 (*)    Potassium 3.1 (*)    Chloride 93 (*)    Glucose, Bld 197 (*)    Calcium 8.6 (*)    All other components within normal limits  URINALYSIS, COMPLETE (UACMP) WITH MICROSCOPIC - Abnormal; Notable for the following components:   Color, Urine STRAW (*)    APPearance CLEAR (*)  Specific Gravity, Urine 1.004 (*)    Squamous Epithelial / LPF 0-5 (*)    All other components within normal limits  CBC WITH DIFFERENTIAL/PLATELET   ____________________________________________  EKG   ____________________________________________  RADIOLOGY   No results found.  ____________________________________________    PROCEDURES  Procedure(s) performed:    Procedures    Medications  promethazine (PHENERGAN) injection 12.5 mg (12.5 mg Intravenous Given 12/09/17 1526)  sodium chloride 0.9 % bolus 500 mL (0 mLs Intravenous Stopped 12/09/17 1759)     ____________________________________________   INITIAL IMPRESSION / ASSESSMENT AND PLAN / ED COURSE  Pertinent labs & imaging results that were available during my care of the patient were reviewed by me and considered in my medical decision making (see chart for details).  Review of the [place] CSRS was performed in accordance of the [place] prior to dispensing any controlled drugs.    Assessment and Plan:  Hypertension Patient presents to the emergency department with hypertension.  Workup  conducted in the emergency department was reassuring.  I do not feel comfortable adjusting patient's pharmacologic regimen for essential hypertension at this time.  Patient was given Phenergan in the emergency department.  Patient reported that her headache and nausea improved significantly.  Patient was advised to consume salty foods in order to correct hyponatremia.  It should be noted that hyponatremic corrected to 131 with patient's current blood glucose level of 197.  Patient voiced understanding regarding this recommendation.  She was advised to follow-up with her primary care provider regarding possible adjustments that need to be made regarding essential hypertension.  Strict return precautions were given to return for new or worsening symptoms.  All patient questions were answered.    ____________________________________________  FINAL CLINICAL IMPRESSION(S) / ED DIAGNOSES  Final diagnoses:  Essential hypertension      NEW MEDICATIONS STARTED DURING THIS VISIT:  ED Discharge Orders    None          This chart was dictated using voice recognition software/Dragon. Despite best efforts to proofread, errors can occur which can change the meaning. Any change was purely unintentional.    Lannie Fields, PA-C 12/09/17 1816    Schuyler Amor, MD 12/18/17 2234

## 2017-12-09 NOTE — ED Triage Notes (Signed)
Patient presents to ED via POV from home with c/o hypertension. Patient currently on Losartan/hctz 100/25mg  one tablet daily and hydralazine 25mg  1 tablet 3x a day. Patient states her BP is normally 130/80. Highest reading at home was 176/100. Patient reports HA as well.

## 2017-12-09 NOTE — ED Notes (Signed)
Pt states that this weekend on Saturday she got HA, nausea, BP  Was 178/96. She says she hasn't missed any of her medications so she is unsure why this is happening. Pt also has Hx of Diabetes. Family at bedside.

## 2018-01-21 ENCOUNTER — Other Ambulatory Visit: Payer: Self-pay | Admitting: Obstetrics & Gynecology

## 2018-01-21 DIAGNOSIS — Z1231 Encounter for screening mammogram for malignant neoplasm of breast: Secondary | ICD-10-CM

## 2018-02-17 ENCOUNTER — Ambulatory Visit
Admission: RE | Admit: 2018-02-17 | Discharge: 2018-02-17 | Disposition: A | Discharge status: Home / Self Care | Payer: BLUE CROSS/BLUE SHIELD | Source: Ambulatory Visit | Attending: Obstetrics & Gynecology | Admitting: Obstetrics & Gynecology

## 2018-02-17 DIAGNOSIS — Z1231 Encounter for screening mammogram for malignant neoplasm of breast: Secondary | ICD-10-CM | POA: Insufficient documentation

## 2018-03-22 ENCOUNTER — Emergency Department
Admission: EM | Admit: 2018-03-22 | Discharge: 2018-03-22 | Disposition: A | Discharge status: Home / Self Care | Payer: BLUE CROSS/BLUE SHIELD | Attending: Emergency Medicine | Admitting: Emergency Medicine

## 2018-03-22 ENCOUNTER — Encounter: Payer: Self-pay | Admitting: Emergency Medicine

## 2018-03-22 ENCOUNTER — Other Ambulatory Visit: Payer: Self-pay

## 2018-03-22 DIAGNOSIS — Z5321 Procedure and treatment not carried out due to patient leaving prior to being seen by health care provider: Secondary | ICD-10-CM | POA: Insufficient documentation

## 2018-03-22 DIAGNOSIS — R42 Dizziness and giddiness: Secondary | ICD-10-CM | POA: Insufficient documentation

## 2018-03-22 LAB — CBC
HCT: 38.7 % (ref 35.0–47.0)
Hemoglobin: 13.2 g/dL (ref 12.0–16.0)
MCH: 29.8 pg (ref 26.0–34.0)
MCHC: 34 g/dL (ref 32.0–36.0)
MCV: 87.6 fL (ref 80.0–100.0)
Platelets: 309 10*3/uL (ref 150–440)
RBC: 4.42 MIL/uL (ref 3.80–5.20)
RDW: 13.8 % (ref 11.5–14.5)
WBC: 6.7 10*3/uL (ref 3.6–11.0)

## 2018-03-22 LAB — BASIC METABOLIC PANEL
Anion gap: 11 (ref 5–15)
BUN: 13 mg/dL (ref 6–20)
CALCIUM: 8.7 mg/dL — AB (ref 8.9–10.3)
CO2: 25 mmol/L (ref 22–32)
CREATININE: 0.79 mg/dL (ref 0.44–1.00)
Chloride: 97 mmol/L — ABNORMAL LOW (ref 101–111)
GFR calc Af Amer: >60 mL/min (ref >60)
GFR calc non Af Amer: >60 mL/min (ref >60)
GLUCOSE: 211 mg/dL — AB (ref 65–99)
Potassium: 3.3 mmol/L — ABNORMAL LOW (ref 3.5–5.1)
Sodium: 133 mmol/L — ABNORMAL LOW (ref 135–145)

## 2018-03-22 LAB — URINALYSIS, COMPLETE (UACMP) WITH MICROSCOPIC
Bacteria, UA: NONE SEEN
Bilirubin Urine: NEGATIVE
Glucose, UA: 50 mg/dL — AB
HGB URINE DIPSTICK: NEGATIVE
Ketones, ur: NEGATIVE mg/dL
Nitrite: NEGATIVE
Protein, ur: NEGATIVE mg/dL
SPECIFIC GRAVITY, URINE: 1.006 (ref 1.005–1.030)
pH: 5 (ref 5.0–8.0)

## 2018-03-22 LAB — GLUCOSE, CAPILLARY: GLUCOSE-CAPILLARY: 211 mg/dL — AB (ref 65–99)

## 2018-03-22 LAB — TROPONIN I: Troponin I: <0.03 ng/mL (ref <0.03)

## 2018-03-22 NOTE — ED Triage Notes (Signed)
Pt to triage via [place], reports dizziness and headache x 1 day, pt reports hx of HTN, has been taking medication as prescribed, pt BP 168/78 in triage, pt reports baseline is SBP 130s.  Pt in NAD at this time, resp equal and unlabored, skin warm and dry.

## 2018-03-24 ENCOUNTER — Other Ambulatory Visit: Payer: Self-pay | Admitting: Family Medicine

## 2018-03-24 ENCOUNTER — Ambulatory Visit
Admission: RE | Admit: 2018-03-24 | Discharge: 2018-03-24 | Disposition: A | Discharge status: Home / Self Care | Payer: BLUE CROSS/BLUE SHIELD | Source: Ambulatory Visit | Attending: Family Medicine | Admitting: Family Medicine

## 2018-03-24 DIAGNOSIS — D164 Benign neoplasm of bones of skull and face: Secondary | ICD-10-CM | POA: Diagnosis not present

## 2018-03-24 DIAGNOSIS — R4189 Other symptoms and signs involving cognitive functions and awareness: Secondary | ICD-10-CM | POA: Insufficient documentation

## 2018-07-30 DIAGNOSIS — G629 Polyneuropathy, unspecified: Secondary | ICD-10-CM | POA: Insufficient documentation

## 2018-08-27 ENCOUNTER — Other Ambulatory Visit: Payer: Self-pay | Admitting: Obstetrics & Gynecology

## 2018-08-27 DIAGNOSIS — N6320 Unspecified lump in the left breast, unspecified quadrant: Secondary | ICD-10-CM

## 2018-09-09 ENCOUNTER — Ambulatory Visit
Admission: RE | Admit: 2018-09-09 | Discharge: 2018-09-09 | Disposition: A | Discharge status: Home / Self Care | Payer: BLUE CROSS/BLUE SHIELD | Source: Ambulatory Visit | Attending: Obstetrics & Gynecology | Admitting: Obstetrics & Gynecology

## 2018-09-09 DIAGNOSIS — N6320 Unspecified lump in the left breast, unspecified quadrant: Secondary | ICD-10-CM | POA: Insufficient documentation

## 2018-09-11 ENCOUNTER — Other Ambulatory Visit: Payer: Self-pay | Admitting: Obstetrics & Gynecology

## 2018-09-11 DIAGNOSIS — N6320 Unspecified lump in the left breast, unspecified quadrant: Secondary | ICD-10-CM

## 2018-09-11 DIAGNOSIS — R928 Other abnormal and inconclusive findings on diagnostic imaging of breast: Secondary | ICD-10-CM

## 2018-09-25 ENCOUNTER — Ambulatory Visit: Payer: BLUE CROSS/BLUE SHIELD

## 2018-10-01 ENCOUNTER — Ambulatory Visit
Admission: RE | Admit: 2018-10-01 | Discharge: 2018-10-01 | Disposition: A | Discharge status: Home / Self Care | Payer: BLUE CROSS/BLUE SHIELD | Source: Ambulatory Visit | Attending: Obstetrics & Gynecology | Admitting: Obstetrics & Gynecology

## 2018-10-01 DIAGNOSIS — R928 Other abnormal and inconclusive findings on diagnostic imaging of breast: Secondary | ICD-10-CM | POA: Diagnosis present

## 2018-10-01 DIAGNOSIS — N6320 Unspecified lump in the left breast, unspecified quadrant: Secondary | ICD-10-CM

## 2018-10-01 HISTORY — PX: BREAST BIOPSY: SHX20

## 2018-10-03 LAB — SURGICAL PATHOLOGY

## 2018-10-07 DIAGNOSIS — D242 Benign neoplasm of left breast: Secondary | ICD-10-CM | POA: Insufficient documentation

## 2018-10-08 ENCOUNTER — Other Ambulatory Visit: Payer: Self-pay | Admitting: General Surgery

## 2018-10-08 ENCOUNTER — Ambulatory Visit: Payer: Self-pay | Admitting: General Surgery

## 2018-10-08 DIAGNOSIS — D242 Benign neoplasm of left breast: Secondary | ICD-10-CM

## 2018-10-08 NOTE — H&P (Signed)
PATIENT PROFILE: Julia Cooper is a 67 y.o. female who presents to the Clinic for consultation at the request of Dr. Leonides Schanz for evaluation of left breast mass.  PCP:  Dion Body, MD  HISTORY OF PRESENT ILLNESS: [name] reports feeling a mass on her left breast at the beginning of September. She called her Gynecologist who ordered a mammogram.  This was done on 09/09/18. A suspicious mass was found and core biopsy was done on 10/01/18. The biopsy showed Intraductal papilloma without atypia. This was concordant with radiologist. I personally evaluated the images and the pathology report. Patient denies pain on breast before biopsy. Denies nipple discharge or bleeding. Patient has history of Intraductal papilloma of the same breast with excision on 2017. At that time she had brown discharge through the nipple. Patient does not has personal history of breast cancer. No close relative history of breast cancer. No history of radiation therapy. No hormone therapy.    PROBLEM LIST:         Problem List  Date Reviewed: 04/30/2018         Noted   Intraductal papilloma of breast, left 10/07/2018   Neuropathy 07/30/2018   Vaccine counseling: PNA-23 vaccine administered on 11/24/15; TDAP vaccine administered on 11/28/11 (11/28/17) 11/28/2017   Type 2 diabetes mellitus with diabetic polyneuropathy, with long-term current use of insulin (A1c 7.2% - 03/05/18) - followed by Dr. Honor Junes 05/18/2014   Hypertension associated with diabetes (CMS-HCC) 03/29/2014   Cervical spinal stenosis s/p surgical repair 03/29/2014   Non morbid obesity due to excess calories 03/29/2014   Depression with anxiety 03/29/2014      GENERAL REVIEW OF SYSTEMS:   General ROS: negative for - chills, fatigue, fever, weight gain or weight loss Allergy and Immunology ROS: negative for - hives  Hematological and Lymphatic ROS: negative for - bleeding problems or bruising, negative for palpable nodes Endocrine  ROS: negative for - heat or cold intolerance, hair changes Respiratory ROS: negative for - cough, shortness of breath or wheezing Cardiovascular ROS: no chest pain or palpitations GI ROS: negative for nausea, vomiting, abdominal pain, diarrhea, constipation Musculoskeletal ROS: negative for - joint swelling or muscle pain Neurological ROS: negative for - confusion, syncope Dermatological ROS: negative for pruritus and rash Psychiatric: negative for anxiety, depression, difficulty sleeping and memory loss  MEDICATIONS: CurrentMedications        Current Outpatient Medications  Medication Sig Dispense Refill  . BD ULTRA-FINE NANO PEN NEEDLE 32 gauge x 5/32" Ndle USE TWICE DAILY AS DIRECTED 100 each 3  . cholecalciferol (VITAMIN D3) 2,000 unit tablet Take 2,000 Units by mouth once daily.    . CONTOUR NEXT TEST STRIPS test strip USE 1 STRIP TWICE DAILY 100 each 1  . escitalopram oxalate (LEXAPRO) 10 MG tablet TAKE 1 TABLET BY MOUTH ONCE DAILY 30 tablet 5  . hydrALAZINE (APRESOLINE) 25 MG tablet TAKE 1 TABLET(25 MG) BY MOUTH THREE TIMES DAILY 270 tablet 1  . insulin NPH (HUMULIN N NPH INSULIN KWIKPEN) pen injector (concentration 100 units/mL) Inject 33 Units subcutaneously once daily (Patient taking differently: Inject 35 Units subcutaneously once daily  ) 30 mL 11  . liraglutide (VICTOZA 2-PAK) 0.6 mg/0.1 mL (18 mg/3 mL) pen injector Inject 0.2 mLs (1.2 mg total) subcutaneously once daily 6 mL 12  . losartan-hydrochlorothiazide (HYZAAR) 100-25 mg tablet TAKE 1 TABLET BY MOUTH EVERY DAY 90 tablet 1  . magnesium oxide (MAG-OX) 400 mg tablet Take 400 mg by mouth once daily.    Marland Kitchen  metFORMIN (FORTAMET) 1000 MG (OSM) 24 hr tablet Take 1 tablet (1,000 mg total) by mouth 2 (two) times daily with meals 180 tablet 3  . multivitamin tablet Take 1 tablet by mouth once daily.    . naproxen sodium (ALEVE, ANAPROX) 220 MG tablet Take 440 mg by mouth once daily as needed      . potassium chloride  (K-DUR,KLOR-CON) 20 MEQ ER tablet TAKE 1 TABLET BY MOUTH EVERY DAY 90 tablet 1   No current facility-administered medications for this visit.       ALLERGIES: Ace inhibitors; Ciprofloxacin; Amlodipine; and Doxazosin  PAST MEDICAL HISTORY:     Past Medical History:  Diagnosis Date  . Cervical spinal stenosis    s/p surgical repair  . Depression with anxiety   . Diabetes mellitus type 2, uncomplicated (CMS-HCC)    dx'd ~2002  . Hypertension   . Obesity     PAST SURGICAL HISTORY:      Past Surgical History:  Procedure Laterality Date  . BREAST EXCISIONAL BIOPSY Left 07/10/2016   x3--BENIGN  . BREAST LUMPECTOMY WITH NEEDLE LOCALIZATION Left 07/20/2016   Dr Rochel Brome, benign  . Cervical spine surgery, 2002    . CHOLECYSTECTOMY    . COLONOSCOPY  04/24/11   normal per pt.  . Left breast biopsy in February 1993, benign    . toe nail removal Left    08/16/15     FAMILY HISTORY:      Family History  Problem Relation Age of Onset  . Stomach cancer Mother   . High blood pressure (Hypertension) Mother   . Diabetes type II Father   . High blood pressure (Hypertension) Father   . High blood pressure (Hypertension) Sister   . High blood pressure (Hypertension) Maternal Grandmother   . High blood pressure (Hypertension) Maternal Grandfather   . Stroke Maternal Grandfather   . Diabetes Paternal Grandmother   . High blood pressure (Hypertension) Paternal Grandfather   . Anuerysm Paternal Grandfather   . High blood pressure (Hypertension) Son   . No Known Problems Son      SOCIAL HISTORY: Social History          Socioeconomic History  . Marital status: Married    Spouse name: Not on file  . Number of children: 2  . Years of education: Not on file  . Highest education level: Not on file  Occupational History  . Not on file  Social Needs  . Financial resource strain: Not on file  . Food insecurity:    Worry: Not on file     Inability: Not on file  . Transportation needs:    Medical: Not on file    Non-medical: Not on file  Tobacco Use  . Smoking status: Never Smoker  . Smokeless tobacco: Never Used  Substance and Sexual Activity  . Alcohol use: No    Alcohol/week: 0.0 standard drinks  . Drug use: No  . Sexual activity: Yes    Partners: Male    Birth control/protection: Post-menopausal    Comment: Husband  Other Topics Concern  . Not on file  Social History Narrative   Marital Status- Married   Lives with husband   Employment- Retired   Exercise hx- [place]:    Vitals:   10/07/18 1503  BP: 136/87  Pulse: 87  Temp: 37.1 C (98.7 F)   Body mass index is 35.98 kg/m. Weight: 99.6 kg (  219 lb 9.3 oz)   GENERAL: Alert, active, oriented x3  HEENT: Pupils equal reactive to light. Extraocular movements are intact. Sclera clear. Palpebral conjunctiva normal red color.Pharynx clear.  NECK: Supple with no palpable mass and no adenopathy.  LUNGS: Sound clear with no rales rhonchi or wheezes.  HEART: Regular rhythm S1 and S2 without murmur.  BREAST: right breast normal without mass, skin or nipple changes or axillary nodes. Left breast normal without mass, skin or nipple changes or axillary nodes. Ecchymosis on biopsy site entry.   ABDOMEN: Soft and depressible, nontender with no palpable mass, no hepatomegaly.   EXTREMITIES: Well-developed well-nourished symmetrical with no dependent edema.  NEUROLOGICAL: Awake alert oriented, facial expression symmetrical, moving all extremities.  REVIEW OF DATA: I have reviewed the following data today:      Office Visit on 07/30/2018  Component Date Value  . Hemoglobin A1C 07/30/2018 7.5*  . Average Blood Glucose (C* 07/30/2018 169     Surgical Pathology CASE: [id number] PATIENT: Melvern Banker Surgical Pathology Report  SPECIMEN SUBMITTED: A. Breast,  left  CLINICAL HISTORY: 67 year old female presenting for evaluation of a palpable lump in the left breast near the areola 6 o'clock.  PRE-OPERATIVE DIAGNOSIS: Small hypoechoic superficial mass in the left breast at 6:30 in the retroareolar location measuring 5 x 3 x 4 mm  POST-OPERATIVE DIAGNOSIS: Heart shaped clip deployed  DIAGNOSIS: A. BREAST, LEFT RETROAREOLAR 6:30; STEREOTACTIC BIOPSY: - INTRADUCTAL PAPILLOMA WITH USUAL DUCTAL HYPERPLASIA AND APOCRINE METAPLASIA. - NEGATIVE FOR ATYPIA AND MALIGNANCY.  ASSESSMENT: Ms. Benningfield is a 67 y.o. female presenting for consultation for left breast mass.    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 The pathology report was discussed with patient including the finding of no atypia or malignancy. Also it was shown that images and pathology report are in concordance. Alternative of observation or surgical management was discussed with patient. Patient refers that she is anxious of leaving a mass with potential of upgrading to malignancy and prefer to have it removed. I oriented the patient about the surgical management. Benefits and risks of surgery were discussed with patient.   PLAN: 1. Needle guided excisional biopsy of left breast (19125) 2. CBC, CMP 3. Dr. Netty Starring clearance 4. Avoid aspirin 5 days before surgery 5. Contact us if has any question or concern.   Patient verbalized understanding, all questions were answered, and were agreeable with the plan outlined above.   This was a 45 minute encounter more than 50% of time counseling patient and coordinating plan of care.   Herbert Pun, MD  Electronically signed by Herbert Pun, MD

## 2018-10-08 NOTE — H&P (View-Only) (Signed)
PATIENT PROFILE: TAELYNN MCELHANNON is a 67 y.o. female who presents to the Clinic for consultation at the request of Dr. Leonides Schanz for evaluation of left breast mass.  PCP:  Dion Body, MD  HISTORY OF PRESENT ILLNESS: Ms. Skidgel reports feeling a mass on her left breast at the beginning of September. She called her Gynecologist who ordered a mammogram.  This was done on 09/09/18. A suspicious mass was found and core biopsy was done on 10/01/18. The biopsy showed Intraductal papilloma without atypia. This was concordant with radiologist. I personally evaluated the images and the pathology report. Patient denies pain on breast before biopsy. Denies nipple discharge or bleeding. Patient has history of Intraductal papilloma of the same breast with excision on 2017. At that time she had brown discharge through the nipple. Patient does not has personal history of breast cancer. No close relative history of breast cancer. No history of radiation therapy. No hormone therapy.    PROBLEM LIST:         Problem List  Date Reviewed: 04/30/2018         Noted   Intraductal papilloma of breast, left 10/07/2018   Neuropathy 07/30/2018   Vaccine counseling: PNA-23 vaccine administered on 11/24/15; TDAP vaccine administered on 11/28/11 (11/28/17) 11/28/2017   Type 2 diabetes mellitus with diabetic polyneuropathy, with long-term current use of insulin (A1c 7.2% - 03/05/18) - followed by Dr. Honor Junes 05/18/2014   Hypertension associated with diabetes (CMS-HCC) 03/29/2014   Cervical spinal stenosis s/p surgical repair 03/29/2014   Non morbid obesity due to excess calories 03/29/2014   Depression with anxiety 03/29/2014      GENERAL REVIEW OF SYSTEMS:   General ROS: negative for - chills, fatigue, fever, weight gain or weight loss Allergy and Immunology ROS: negative for - hives  Hematological and Lymphatic ROS: negative for - bleeding problems or bruising, negative for palpable nodes Endocrine  ROS: negative for - heat or cold intolerance, hair changes Respiratory ROS: negative for - cough, shortness of breath or wheezing Cardiovascular ROS: no chest pain or palpitations GI ROS: negative for nausea, vomiting, abdominal pain, diarrhea, constipation Musculoskeletal ROS: negative for - joint swelling or muscle pain Neurological ROS: negative for - confusion, syncope Dermatological ROS: negative for pruritus and rash Psychiatric: negative for anxiety, depression, difficulty sleeping and memory loss  MEDICATIONS: CurrentMedications        Current Outpatient Medications  Medication Sig Dispense Refill  . BD ULTRA-FINE NANO PEN NEEDLE 32 gauge x 5/32" Ndle USE TWICE DAILY AS DIRECTED 100 each 3  . cholecalciferol (VITAMIN D3) 2,000 unit tablet Take 2,000 Units by mouth once daily.    . CONTOUR NEXT TEST STRIPS test strip USE 1 STRIP TWICE DAILY 100 each 1  . escitalopram oxalate (LEXAPRO) 10 MG tablet TAKE 1 TABLET BY MOUTH ONCE DAILY 30 tablet 5  . hydrALAZINE (APRESOLINE) 25 MG tablet TAKE 1 TABLET(25 MG) BY MOUTH THREE TIMES DAILY 270 tablet 1  . insulin NPH (HUMULIN N NPH INSULIN KWIKPEN) pen injector (concentration 100 units/mL) Inject 33 Units subcutaneously once daily (Patient taking differently: Inject 35 Units subcutaneously once daily  ) 30 mL 11  . liraglutide (VICTOZA 2-PAK) 0.6 mg/0.1 mL (18 mg/3 mL) pen injector Inject 0.2 mLs (1.2 mg total) subcutaneously once daily 6 mL 12  . losartan-hydrochlorothiazide (HYZAAR) 100-25 mg tablet TAKE 1 TABLET BY MOUTH EVERY DAY 90 tablet 1  . magnesium oxide (MAG-OX) 400 mg tablet Take 400 mg by mouth once daily.    Marland Kitchen  metFORMIN (FORTAMET) 1000 MG (OSM) 24 hr tablet Take 1 tablet (1,000 mg total) by mouth 2 (two) times daily with meals 180 tablet 3  . multivitamin tablet Take 1 tablet by mouth once daily.    . naproxen sodium (ALEVE, ANAPROX) 220 MG tablet Take 440 mg by mouth once daily as needed      . potassium chloride  (K-DUR,KLOR-CON) 20 MEQ ER tablet TAKE 1 TABLET BY MOUTH EVERY DAY 90 tablet 1   No current facility-administered medications for this visit.       ALLERGIES: Ace inhibitors; Ciprofloxacin; Amlodipine; and Doxazosin  PAST MEDICAL HISTORY:     Past Medical History:  Diagnosis Date  . Cervical spinal stenosis    s/p surgical repair  . Depression with anxiety   . Diabetes mellitus type 2, uncomplicated (CMS-HCC)    dx'd ~2002  . Hypertension   . Obesity     PAST SURGICAL HISTORY:      Past Surgical History:  Procedure Laterality Date  . BREAST EXCISIONAL BIOPSY Left 07/10/2016   x3--BENIGN  . BREAST LUMPECTOMY WITH NEEDLE LOCALIZATION Left 07/20/2016   Dr Rochel Brome, benign  . Cervical spine surgery, 2002    . CHOLECYSTECTOMY    . COLONOSCOPY  04/24/11   normal per pt.  . Left breast biopsy in February 1993, benign    . toe nail removal Left    08/16/15     FAMILY HISTORY:      Family History  Problem Relation Age of Onset  . Stomach cancer Mother   . High blood pressure (Hypertension) Mother   . Diabetes type II Father   . High blood pressure (Hypertension) Father   . High blood pressure (Hypertension) Sister   . High blood pressure (Hypertension) Maternal Grandmother   . High blood pressure (Hypertension) Maternal Grandfather   . Stroke Maternal Grandfather   . Diabetes Paternal Grandmother   . High blood pressure (Hypertension) Paternal Grandfather   . Anuerysm Paternal Grandfather   . High blood pressure (Hypertension) Son   . No Known Problems Son      SOCIAL HISTORY: Social History          Socioeconomic History  . Marital status: Married    Spouse name: Not on file  . Number of children: 2  . Years of education: Not on file  . Highest education level: Not on file  Occupational History  . Not on file  Social Needs  . Financial resource strain: Not on file  . Food insecurity:    Worry: Not on file     Inability: Not on file  . Transportation needs:    Medical: Not on file    Non-medical: Not on file  Tobacco Use  . Smoking status: Never Smoker  . Smokeless tobacco: Never Used  Substance and Sexual Activity  . Alcohol use: No    Alcohol/week: 0.0 standard drinks  . Drug use: No  . Sexual activity: Yes    Partners: Male    Birth control/protection: Post-menopausal    Comment: Husband  Other Topics Concern  . Not on file  Social History Narrative   Marital Status- Married   Lives with husband   Employment- Retired   Exercise hx- [place]:    Vitals:   10/07/18 1503  BP: 136/87  Pulse: 87  Temp: 37.1 C (98.7 F)   Body mass index is 35.98 kg/m. Weight: 99.6 kg (  219 lb 9.3 oz)   GENERAL: Alert, active, oriented x3  HEENT: Pupils equal reactive to light. Extraocular movements are intact. Sclera clear. Palpebral conjunctiva normal red color.Pharynx clear.  NECK: Supple with no palpable mass and no adenopathy.  LUNGS: Sound clear with no rales rhonchi or wheezes.  HEART: Regular rhythm S1 and S2 without murmur.  BREAST: right breast normal without mass, skin or nipple changes or axillary nodes. Left breast normal without mass, skin or nipple changes or axillary nodes. Ecchymosis on biopsy site entry.   ABDOMEN: Soft and depressible, nontender with no palpable mass, no hepatomegaly.   EXTREMITIES: Well-developed well-nourished symmetrical with no dependent edema.  NEUROLOGICAL: Awake alert oriented, facial expression symmetrical, moving all extremities.  REVIEW OF DATA: I have reviewed the following data today:      Office Visit on 07/30/2018  Component Date Value  . Hemoglobin A1C 07/30/2018 7.5*  . Average Blood Glucose (C* 07/30/2018 169     Surgical Pathology CASE: [id number] PATIENT: Melvern Banker Surgical Pathology Report  SPECIMEN SUBMITTED: A. Breast,  left  CLINICAL HISTORY: 67 year old female presenting for evaluation of a palpable lump in the left breast near the areola 6 o'clock.  PRE-OPERATIVE DIAGNOSIS: Small hypoechoic superficial mass in the left breast at 6:30 in the retroareolar location measuring 5 x 3 x 4 mm  POST-OPERATIVE DIAGNOSIS: Heart shaped clip deployed  DIAGNOSIS: A. BREAST, LEFT RETROAREOLAR 6:30; STEREOTACTIC BIOPSY: - INTRADUCTAL PAPILLOMA WITH USUAL DUCTAL HYPERPLASIA AND APOCRINE METAPLASIA. - NEGATIVE FOR ATYPIA AND MALIGNANCY.  ASSESSMENT: Ms. Mcguffee is a 67 y.o. female presenting for consultation for left breast mass.    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 The pathology report was discussed with patient including the finding of no atypia or malignancy. Also it was shown that images and pathology report are in concordance. Alternative of observation or surgical management was discussed with patient. Patient refers that she is anxious of leaving a mass with potential of upgrading to malignancy and prefer to have it removed. I oriented the patient about the surgical management. Benefits and risks of surgery were discussed with patient.   PLAN: 1. Needle guided excisional biopsy of left breast (19125) 2. CBC, CMP 3. Dr. Netty Starring clearance 4. Avoid aspirin 5 days before surgery 5. Contact us if has any question or concern.   Patient verbalized understanding, all questions were answered, and were agreeable with the plan outlined above.   This was a 45 minute encounter more than 50% of time counseling patient and coordinating plan of care.   Herbert Pun, MD  Electronically signed by Herbert Pun, MD

## 2018-10-16 ENCOUNTER — Other Ambulatory Visit: Payer: Self-pay

## 2018-10-16 ENCOUNTER — Encounter
Admission: RE | Admit: 2018-10-16 | Discharge: 2018-10-16 | Disposition: A | Discharge status: Home / Self Care | Payer: BLUE CROSS/BLUE SHIELD | Source: Ambulatory Visit | Attending: General Surgery | Admitting: General Surgery

## 2018-10-16 HISTORY — DX: Glossodynia: K14.6

## 2018-10-16 HISTORY — DX: Unspecified osteoarthritis, unspecified site: M19.90

## 2018-10-16 NOTE — Patient Instructions (Signed)
Your procedure is scheduled on: 10/22/18 Report to [facility] AM .  Remember: Instructions that are not followed completely may result in serious medical risk,  up to and including death, or upon the discretion of your surgeon and anesthesiologist your  surgery may need to be rescheduled.     _X__ 1. Do not eat food after midnight the night before your procedure.                 No gum chewing or hard candies. You may drink clear liquids up to 2 hours                 before you are scheduled to arrive for your surgery- DO not drink clear                 liquids within 2 hours of the start of your surgery.                 Clear Liquids include:  water, apple juice without pulp, clear carbohydrate                 drink such as Clearfast of Gatorade, Black Coffee or Tea (Do not add                 anything to coffee or tea).  __X__2.  On the morning of surgery brush your teeth with toothpaste and water, you                may rinse your mouth with mouthwash if you wish.  Do not swallow any toothpaste of mouthwash.     _X__ 3.  No Alcohol for 24 hours before or after surgery.   _X__ 4.  Do Not Smoke or use e-cigarettes For 24 Hours Prior to Your Surgery.                 Do not use any chewable tobacco products for at least 6 hours prior to                 surgery.  ____  5.  Bring all medications with you on the day of surgery if instructed.   __X__  6.  Notify your doctor if there is any change in your medical condition      (cold, fever, infections).     Do not wear jewelry, make-up, hairpins, clips or nail polish. Do not wear lotions, powders, or perfumes. You may wear deodorant. Do not shave 48 hours prior to surgery. Men may shave face and neck. Do not bring valuables to the hospital.    [facility] is not responsible for any belongings or valuables.  Contacts, dentures or bridgework may not be worn into surgery. Leave your suitcase in the  car. After surgery it may be brought to your room. For patients admitted to the hospital, discharge time is determined by your treatment team.   Patients discharged the day of surgery will not be allowed to drive home.   Please read over the following fact sheets that you were given:   Surgical Site Infection Prevention   __X__ Take these medicines the morning of surgery with A SIP OF WATER:    1. HYDRALAZINE  2. MAG OX  3.   4.  5.  6.  ____ Fleet Enema (as directed)   ___X_ Use CHG Soap as directed  ____ Use inhalers on the day of surgery  _X___ Stop metformin 2 days prior  to surgery    __X__ Take 1/2 of usual insulin dose the night before surgery. No insulin the morning          of surgery.   ____ Stop Coumadin/Plavix/aspirin on   __X__ Stop Anti-inflammatories on    [place]   ____ Stop supplements until after surgery.    ____ Bring C-Pap to the hospital.

## 2018-10-17 HISTORY — PX: BREAST LUMPECTOMY: SHX2

## 2018-10-22 ENCOUNTER — Ambulatory Visit: Payer: BLUE CROSS/BLUE SHIELD | Admitting: Certified Registered Nurse Anesthetist

## 2018-10-22 ENCOUNTER — Ambulatory Visit
Admission: RE | Admit: 2018-10-22 | Discharge: 2018-10-22 | Disposition: A | Discharge status: Home / Self Care | Payer: BLUE CROSS/BLUE SHIELD | Source: Ambulatory Visit | Attending: General Surgery | Admitting: General Surgery

## 2018-10-22 ENCOUNTER — Encounter
Admission: RE | Disposition: A | Discharge status: Home / Self Care | Payer: Self-pay | Source: Ambulatory Visit | Attending: General Surgery

## 2018-10-22 DIAGNOSIS — F329 Major depressive disorder, single episode, unspecified: Secondary | ICD-10-CM | POA: Diagnosis not present

## 2018-10-22 DIAGNOSIS — Z8249 Family history of ischemic heart disease and other diseases of the circulatory system: Secondary | ICD-10-CM | POA: Diagnosis not present

## 2018-10-22 DIAGNOSIS — Z800 Family history of malignant neoplasm of digestive organs: Secondary | ICD-10-CM | POA: Insufficient documentation

## 2018-10-22 DIAGNOSIS — Z794 Long term (current) use of insulin: Secondary | ICD-10-CM | POA: Diagnosis not present

## 2018-10-22 DIAGNOSIS — Z833 Family history of diabetes mellitus: Secondary | ICD-10-CM | POA: Insufficient documentation

## 2018-10-22 DIAGNOSIS — Z881 Allergy status to other antibiotic agents status: Secondary | ICD-10-CM | POA: Insufficient documentation

## 2018-10-22 DIAGNOSIS — E1169 Type 2 diabetes mellitus with other specified complication: Secondary | ICD-10-CM | POA: Diagnosis not present

## 2018-10-22 DIAGNOSIS — Z23 Encounter for immunization: Secondary | ICD-10-CM | POA: Insufficient documentation

## 2018-10-22 DIAGNOSIS — Z79899 Other long term (current) drug therapy: Secondary | ICD-10-CM | POA: Diagnosis not present

## 2018-10-22 DIAGNOSIS — I10 Essential (primary) hypertension: Secondary | ICD-10-CM | POA: Insufficient documentation

## 2018-10-22 DIAGNOSIS — D242 Benign neoplasm of left breast: Secondary | ICD-10-CM | POA: Insufficient documentation

## 2018-10-22 DIAGNOSIS — Z823 Family history of stroke: Secondary | ICD-10-CM | POA: Insufficient documentation

## 2018-10-22 DIAGNOSIS — F419 Anxiety disorder, unspecified: Secondary | ICD-10-CM | POA: Diagnosis not present

## 2018-10-22 DIAGNOSIS — E1140 Type 2 diabetes mellitus with diabetic neuropathy, unspecified: Secondary | ICD-10-CM | POA: Insufficient documentation

## 2018-10-22 DIAGNOSIS — Z9049 Acquired absence of other specified parts of digestive tract: Secondary | ICD-10-CM | POA: Insufficient documentation

## 2018-10-22 DIAGNOSIS — Z888 Allergy status to other drugs, medicaments and biological substances status: Secondary | ICD-10-CM | POA: Diagnosis not present

## 2018-10-22 DIAGNOSIS — M1990 Unspecified osteoarthritis, unspecified site: Secondary | ICD-10-CM | POA: Diagnosis not present

## 2018-10-22 HISTORY — PX: BREAST LUMPECTOMY WITH NEEDLE LOCALIZATION: SHX5759

## 2018-10-22 HISTORY — PX: BREAST EXCISIONAL BIOPSY: SUR124

## 2018-10-22 LAB — GLUCOSE, CAPILLARY
Glucose-Capillary: 163 mg/dL — ABNORMAL HIGH (ref 70–99)
Glucose-Capillary: 137 mg/dL — ABNORMAL HIGH (ref 70–99)

## 2018-10-22 SURGERY — BREAST LUMPECTOMY WITH NEEDLE LOCALIZATION
Anesthesia: General | Site: Breast | Laterality: Left

## 2018-10-22 MED ORDER — FAMOTIDINE 20 MG PO TABS
20.0000 mg | ORAL_TABLET | Freq: Once | ORAL | Status: AC
  Administered 2018-10-22: 20 mg via ORAL

## 2018-10-22 MED ORDER — PROPOFOL 10 MG/ML IV BOLUS
INTRAVENOUS | Status: AC
  Filled 2018-10-22: qty 20

## 2018-10-22 MED ORDER — ONDANSETRON HCL 4 MG/2ML IJ SOLN
INTRAMUSCULAR | Status: AC
  Filled 2018-10-22: qty 2

## 2018-10-22 MED ORDER — ONDANSETRON HCL 4 MG/2ML IJ SOLN
INTRAMUSCULAR | Status: DC | PRN
  Administered 2018-10-22: 4 mg via INTRAVENOUS

## 2018-10-22 MED ORDER — DEXAMETHASONE SODIUM PHOSPHATE 10 MG/ML IJ SOLN
INTRAMUSCULAR | Status: DC | PRN
  Administered 2018-10-22: 5 mg via INTRAVENOUS

## 2018-10-22 MED ORDER — DEXAMETHASONE SODIUM PHOSPHATE 10 MG/ML IJ SOLN
INTRAMUSCULAR | Status: AC
  Filled 2018-10-22: qty 1

## 2018-10-22 MED ORDER — MIDAZOLAM HCL 2 MG/2ML IJ SOLN
INTRAMUSCULAR | Status: DC | PRN
  Administered 2018-10-22: 2 mg via INTRAVENOUS

## 2018-10-22 MED ORDER — PROPOFOL 10 MG/ML IV BOLUS
INTRAVENOUS | Status: DC | PRN
  Administered 2018-10-22: 200 mg via INTRAVENOUS

## 2018-10-22 MED ORDER — LIDOCAINE HCL (PF) 2 % IJ SOLN
INTRAMUSCULAR | Status: AC
  Filled 2018-10-22: qty 10

## 2018-10-22 MED ORDER — FAMOTIDINE 20 MG PO TABS
ORAL_TABLET | ORAL | Status: AC
  Filled 2018-10-22: qty 1

## 2018-10-22 MED ORDER — HYDROCODONE-ACETAMINOPHEN 5-325 MG PO TABS: 1.0000 | ORAL_TABLET | ORAL | 0 refills | Status: AC | PRN

## 2018-10-22 MED ORDER — CEFAZOLIN SODIUM-DEXTROSE 2-4 GM/100ML-% IV SOLN
INTRAVENOUS | Status: AC
  Filled 2018-10-22: qty 100

## 2018-10-22 MED ORDER — PROMETHAZINE HCL 25 MG/ML IJ SOLN: 6.2500 mg | INTRAMUSCULAR | Status: DC | PRN

## 2018-10-22 MED ORDER — HYDROCODONE-ACETAMINOPHEN 5-325 MG PO TABS
ORAL_TABLET | ORAL | Status: AC
  Filled 2018-10-22: qty 1

## 2018-10-22 MED ORDER — FENTANYL CITRATE (PF) 100 MCG/2ML IJ SOLN
INTRAMUSCULAR | Status: DC | PRN
  Administered 2018-10-22 (×2): 50 ug via INTRAVENOUS

## 2018-10-22 MED ORDER — CEFAZOLIN SODIUM-DEXTROSE 2-4 GM/100ML-% IV SOLN
2.0000 g | INTRAVENOUS | Status: AC
  Administered 2018-10-22: 2 g via INTRAVENOUS

## 2018-10-22 MED ORDER — PROPOFOL 500 MG/50ML IV EMUL
INTRAVENOUS | Status: DC | PRN
  Administered 2018-10-22: 50 ug/kg/min via INTRAVENOUS

## 2018-10-22 MED ORDER — EPHEDRINE SULFATE 50 MG/ML IJ SOLN
INTRAMUSCULAR | Status: AC
  Filled 2018-10-22: qty 1

## 2018-10-22 MED ORDER — DEXMEDETOMIDINE HCL 200 MCG/2ML IV SOLN
INTRAVENOUS | Status: DC | PRN
  Administered 2018-10-22: 8 ug via INTRAVENOUS
  Administered 2018-10-22: 4 ug via INTRAVENOUS

## 2018-10-22 MED ORDER — LIDOCAINE HCL (CARDIAC) PF 100 MG/5ML IV SOSY
PREFILLED_SYRINGE | INTRAVENOUS | Status: DC | PRN
  Administered 2018-10-22: 40 mg via INTRAVENOUS
  Administered 2018-10-22: 100 mg via INTRAVENOUS

## 2018-10-22 MED ORDER — FENTANYL CITRATE (PF) 100 MCG/2ML IJ SOLN: 25.0000 ug | INTRAMUSCULAR | Status: DC | PRN

## 2018-10-22 MED ORDER — HYDROCODONE-ACETAMINOPHEN 5-325 MG PO TABS
1.0000 | ORAL_TABLET | ORAL | Status: DC | PRN
  Administered 2018-10-22: 1 via ORAL

## 2018-10-22 MED ORDER — SODIUM CHLORIDE 0.9 % IV SOLN
INTRAVENOUS | Status: DC
  Administered 2018-10-22: 09:00:00 via INTRAVENOUS

## 2018-10-22 MED ORDER — PHENYLEPHRINE HCL 10 MG/ML IJ SOLN
INTRAMUSCULAR | Status: DC | PRN
  Administered 2018-10-22: 50 ug via INTRAVENOUS

## 2018-10-22 SURGICAL SUPPLY — 31 items
CANISTER SUCT 1200ML W/VALVE (MISCELLANEOUS) ×3 IMPLANT
CHLORAPREP W/TINT 26ML (MISCELLANEOUS) ×3 IMPLANT
CNTNR SPEC 2.5X3XGRAD LEK (MISCELLANEOUS)
CONT SPEC 4OZ STER OR WHT (MISCELLANEOUS)
CONTAINER SPEC 2.5X3XGRAD LEK (MISCELLANEOUS) IMPLANT
COVER WAND RF STERILE (DRAPES) IMPLANT
DERMABOND ADVANCED (GAUZE/BANDAGES/DRESSINGS) ×2
DERMABOND ADVANCED .7 DNX12 (GAUZE/BANDAGES/DRESSINGS) ×1 IMPLANT
DEVICE DUBIN SPECIMEN MAMMOGRA (MISCELLANEOUS) ×3 IMPLANT
DRAPE LAPAROTOMY 77X122 PED (DRAPES) ×3 IMPLANT
ELECT REM PT RETURN 9FT ADLT (ELECTROSURGICAL) ×3
ELECTRODE REM PT RTRN 9FT ADLT (ELECTROSURGICAL) ×1 IMPLANT
GLOVE BIO SURGEON STRL SZ 6.5 (GLOVE) ×2 IMPLANT
GLOVE BIO SURGEONS STRL SZ 6.5 (GLOVE) ×1
GOWN STRL REUS W/ TWL LRG LVL3 (GOWN DISPOSABLE) ×3 IMPLANT
GOWN STRL REUS W/TWL LRG LVL3 (GOWN DISPOSABLE) ×6
KIT TURNOVER KIT A (KITS) ×3 IMPLANT
LABEL OR SOLS (LABEL) ×3 IMPLANT
MARGIN MAP 10MM (MISCELLANEOUS) IMPLANT
NEEDLE HYPO 25X1 1.5 SAFETY (NEEDLE) ×6 IMPLANT
PACK BASIN MINOR ARMC (MISCELLANEOUS) ×3 IMPLANT
SUT ETHILON 3-0 FS-10 30 BLK (SUTURE) ×3
SUT MNCRL 4-0 (SUTURE) ×2
SUT MNCRL 4-0 27XMFL (SUTURE) ×1
SUT SILK 2 0 SH (SUTURE) ×3 IMPLANT
SUT VIC AB 3-0 SH 27 (SUTURE) ×2
SUT VIC AB 3-0 SH 27X BRD (SUTURE) ×1 IMPLANT
SUTURE EHLN 3-0 FS-10 30 BLK (SUTURE) ×1 IMPLANT
SUTURE MNCRL 4-0 27XMF (SUTURE) ×1 IMPLANT
SYR 10ML LL (SYRINGE) ×3 IMPLANT
WATER STERILE IRR 1000ML POUR (IV SOLUTION) ×3 IMPLANT

## 2018-10-22 NOTE — Anesthesia Post-op Follow-up Note (Signed)
Anesthesia QCDR form completed.        

## 2018-10-22 NOTE — Op Note (Signed)
Preoperative diagnosis: Left breast intraductal papilloma.  Postoperative diagnosis: Left breast intraductal papilloma.   Procedure: Left needle-localized breast lumpectomy.                       Anesthesia: GETA  Surgeon: Dr. Windell Moment  Wound Classification: Clean  Indications: Patient is a 67 y.o. female with a nonpalpable left breast lesion noted on mammography with core biopsy demonstrating intraductal papilloma requires needle-localized lumpectomy for treatment.   Findings: 1. Specimen mammography shows marker and wire on specimen 2. Adequate hemostasis 3. No other palpable mass or lymph node identified.   Description of procedure: Preoperative needle localization was performed by radiology. Localization studies were reviewed. The patient was taken to the operating room and placed supine on the operating table, and after general anesthesia the left chest was prepped and draped in the usual sterile fashion. A time-out was completed verifying correct patient, procedure, site, positioning, and implant(s) and/or special equipment prior to beginning this procedure.  By comparing the localization studies with the direction and skin entry site of the needle, the probable trajectory and location of the mass was visualized. A circumareolar skin incision was planned in such a way as to minimize the amount of dissection to reach the mass.  The skin incision was made. Flaps were raised and the location of the wire confirmed. The wire was delivered into the wound. A 2-0 silk figure-of-eight stay suture was placed around the wire and used for retraction. Dissection was then taken down circumferentially, taking care to include the entire localizing needle and a wide margin of grossly normal tissue. The specimen and entire localizing wire were removed. The specimen was oriented and sent to radiology with the localization studies. Confirmation was received that the entire target lesion had been  resected. The wound was irrigated. Hemostasis was checked. The wound was closed with interrupted sutures of 4-0 Vicryl and a subcuticular suture of Monocryl 4-0. No attempt was made to close the dead space. A dressing was applied.   Specimen: Left Breast mass (Orientation markers used: Cranial, Medial)                   Complications: None  Estimated Blood Loss: 10 mL

## 2018-10-22 NOTE — Transfer of Care (Signed)
Immediate Anesthesia Transfer of Care Note  Patient: Julia Cooper  Procedure(s) Performed: BREAST BIOPSY  WITH NEEDLE LOCALIZATION (Left Breast)  Patient Location: PACU  Anesthesia Type:General  Level of Consciousness: drowsy  Airway & Oxygen Therapy: Patient Spontanous Breathing  Post-op Assessment: Report given to RN  Post vital signs: stable  Last Vitals:  Vitals Value Taken Time  BP 120/71 10/22/2018  1:09 PM  Temp 37.1 C 10/22/2018  1:09 PM  Pulse 77 10/22/2018  1:12 PM  Resp 14 10/22/2018  1:12 PM  SpO2 95 % 10/22/2018  1:12 PM  Vitals shown include unvalidated device data.  Last Pain:  Vitals:   10/22/18 1309  TempSrc:   PainSc: Asleep         Complications: No apparent anesthesia complications

## 2018-10-22 NOTE — Interval H&P Note (Signed)
History and Physical Interval Note:  10/22/2018 11:09 AM  Julia Cooper  has presented today for surgery, with the diagnosis of INTRADUCTAL PAPOLLOMA OF LEFT BREAST  The various methods of treatment have been discussed with the patient and family. After consideration of risks, benefits and other options for treatment, the patient has consented to  Procedure(s): BREAST BIOPSY  WITH NEEDLE LOCALIZATION (Left) as a surgical intervention .  The patient's history has been reviewed, patient examined, no change in status, stable for surgery.  I have reviewed the patient's chart and labs. Left chest marked in the pre procedure room.  Questions were answered to the patient's satisfaction.     Herbert Pun

## 2018-10-22 NOTE — Anesthesia Procedure Notes (Signed)
Procedure Name: LMA Insertion Date/Time: 10/22/2018 11:39 AM Performed by: Martha Clan, MD Pre-anesthesia Checklist: Patient identified, Emergency Drugs available, Suction available, Patient being monitored and Timeout performed Patient Re-evaluated:Patient Re-evaluated prior to induction Preoxygenation: Pre-oxygenation with 100% oxygen Induction Type: IV induction Ventilation: Mask ventilation without difficulty LMA: LMA inserted LMA Size: 3.5 Number of attempts: 1 Tube secured with: Tape

## 2018-10-22 NOTE — Discharge Instructions (Signed)
°  Diet: Resume home heart healthy regular diet.   Activity: No heavy lifting >20 pounds (children, pets, laundry, garbage) or strenuous activity until follow-up, but light activity and walking are encouraged. Do not drive or drink alcohol if taking narcotic pain medications.  Wound care: May shower with soapy water and pat dry (do not rub incisions), but no baths or submerging incision underwater until follow-up. (no swimming)   Medications: Resume all home medications. For mild to moderate pain: acetaminophen (Tylenol) or ibuprofen (if no kidney disease). Combining Tylenol with alcohol can substantially increase your risk of causing liver disease. Narcotic pain medications, if prescribed, can be used for severe pain, though may cause nausea, constipation, and drowsiness. Do not combine Tylenol and Norco within a 6 hour period as Norco contains Tylenol. If you do not need the narcotic pain medication, you do not need to fill the prescription.  Call office [phone]) at any time if any questions, worsening pain, fevers/chills, bleeding, drainage from incision site, or other concerns.     AMBULATORY SURGERY  DISCHARGE INSTRUCTIONS   1) The drugs that you were given will stay in your system until tomorrow so for the next 24 hours you should not:  A) Drive an automobile B) Make any legal decisions C) Drink any alcoholic beverage   2) You may resume regular meals tomorrow.  Today it is better to start with liquids and gradually work up to solid foods.  You may eat anything you prefer, but it is better to start with liquids, then soup and crackers, and gradually work up to solid foods.   3) Please notify your doctor immediately if you have any unusual bleeding, trouble breathing, redness and pain at the surgery site, drainage, fever, or pain not relieved by medication.    4) Additional Instructions:        Please contact your physician with any problems or Same Day Surgery at  [phone], Monday through Friday 6 am to 4 pm, or [place] at [facility] number at [phone].AMBULATORY SURGERY  DISCHARGE INSTRUCTIONS   5) The drugs that you were given will stay in your system until tomorrow so for the next 24 hours you should not:  D) Drive an automobile E) Make any legal decisions F) Drink any alcoholic beverage   6) You may resume regular meals tomorrow.  Today it is better to start with liquids and gradually work up to solid foods.  You may eat anything you prefer, but it is better to start with liquids, then soup and crackers, and gradually work up to solid foods.   7) Please notify your doctor immediately if you have any unusual bleeding, trouble breathing, redness and pain at the surgery site, drainage, fever, or pain not relieved by medication.    8) Additional Instructions:        Please contact your physician with any problems or Same Day Surgery at [phone], Monday through Friday 6 am to 4 pm, or [place] at [facility] number at [phone].

## 2018-10-22 NOTE — Anesthesia Preprocedure Evaluation (Signed)
Anesthesia Evaluation  Patient identified by MRN, date of birth, ID band Patient confused    Reviewed: Allergy & Precautions, NPO status , Patient's Chart, lab work & pertinent test results  History of Anesthesia Complications (+) PONV, Family history of anesthesia reaction and history of anesthetic complications  Airway Mallampati: III       Dental  (+) Teeth Intact, Dental Advidsory Given, Caps   Pulmonary neg pulmonary ROS,           Cardiovascular Exercise Tolerance: Good hypertension, Pt. on medications (-) angina(-) CAD, (-) Past MI, (-) Cardiac Stents and (-) CABG (-) dysrhythmias (-) Valvular Problems/Murmurs     Neuro/Psych PSYCHIATRIC DISORDERS Anxiety negative neurological ROS     GI/Hepatic negative GI ROS, Neg liver ROS,   Endo/Other  diabetes, Type 2, Oral Hypoglycemic AgentsMorbid obesity  Renal/GU negative Renal ROS     Musculoskeletal   Abdominal (+) + obese,   Peds  Hematology negative hematology ROS (+)   Anesthesia Other Findings Past Medical History: No date: Anxiety No date: Arthritis No date: Diabetes mellitus without complication (HCC) No date: Family history of adverse reaction to anesthesia     Comment:  father  N/V No date: Hypertension No date: PONV (postoperative nausea and vomiting) No date: Tongue burning sensation   Reproductive/Obstetrics negative OB ROS                             Anesthesia Physical  Anesthesia Plan  ASA: III  Anesthesia Plan: General   Post-op Pain Management:    Induction: Intravenous  PONV Risk Score and Plan: 4 or greater and Ondansetron, Dexamethasone, Midazolam, Promethazine and Treatment may vary due to age or medical condition  Airway Management Planned: LMA  Additional Equipment:   Intra-op Plan:   Post-operative Plan: Extubation in OR  Informed Consent: I have reviewed the patients History and Physical,  chart, labs and discussed the procedure including the risks, benefits and alternatives for the proposed anesthesia with the patient or authorized representative who has indicated his/her understanding and acceptance.   Dental Advisory Given  Plan Discussed with: CRNA  Anesthesia Plan Comments:         Anesthesia Quick Evaluation

## 2018-10-22 NOTE — OR Nursing (Signed)
Discharge instructions discussed with pt and husband. Both voice understanding. 

## 2018-10-24 LAB — SURGICAL PATHOLOGY

## 2018-10-24 NOTE — Anesthesia Postprocedure Evaluation (Signed)
Anesthesia Post Note  Patient: Julia Cooper  Procedure(s) Performed: BREAST BIOPSY  WITH NEEDLE LOCALIZATION (Left Breast)  Patient location during evaluation: PACU Anesthesia Type: General Level of consciousness: awake and alert Pain management: pain level controlled Vital Signs Assessment: post-procedure vital signs reviewed and stable Respiratory status: spontaneous breathing, nonlabored ventilation, respiratory function stable and patient connected to nasal cannula oxygen Cardiovascular status: blood pressure returned to baseline and stable Postop Assessment: no apparent nausea or vomiting Anesthetic complications: no     Last Vitals:  Vitals:   10/22/18 1400 10/22/18 1445  BP: (!) 148/84 (!) 158/83  Pulse: 70 70  Resp: 16 16  Temp: (!) 36.2 C   SpO2: 97% 99%    Last Pain:  Vitals:   10/22/18 1419  TempSrc:   PainSc: 4                  Martha Clan

## 2019-01-22 ENCOUNTER — Other Ambulatory Visit: Payer: Self-pay | Admitting: Obstetrics & Gynecology

## 2019-01-22 DIAGNOSIS — Z1231 Encounter for screening mammogram for malignant neoplasm of breast: Secondary | ICD-10-CM

## 2019-02-19 ENCOUNTER — Ambulatory Visit
Admission: RE | Admit: 2019-02-19 | Discharge: 2019-02-19 | Disposition: A | Discharge status: Home / Self Care | Payer: Medicare Other | Source: Ambulatory Visit | Attending: Obstetrics & Gynecology | Admitting: Obstetrics & Gynecology

## 2019-02-19 DIAGNOSIS — Z1231 Encounter for screening mammogram for malignant neoplasm of breast: Secondary | ICD-10-CM | POA: Diagnosis not present

## 2019-04-23 ENCOUNTER — Other Ambulatory Visit: Payer: Self-pay | Admitting: Physician Assistant

## 2019-04-23 DIAGNOSIS — M2392 Unspecified internal derangement of left knee: Secondary | ICD-10-CM

## 2019-05-05 ENCOUNTER — Ambulatory Visit
Admission: RE | Admit: 2019-05-05 | Discharge: 2019-05-05 | Disposition: A | Discharge status: Home / Self Care | Payer: Medicare Other | Source: Ambulatory Visit | Attending: Physician Assistant | Admitting: Physician Assistant

## 2019-05-05 ENCOUNTER — Other Ambulatory Visit: Payer: Self-pay

## 2019-05-05 DIAGNOSIS — M2392 Unspecified internal derangement of left knee: Secondary | ICD-10-CM | POA: Insufficient documentation

## 2019-06-03 NOTE — Discharge Instructions (Signed)
°  Instructions after Knee Arthroscopy  ° ° Nikoli Nasser P. Karas Pickerill, Jr., M.D.    ° Dept. of Orthopaedics & Sports Medicine ° [facility] ° [address] ° [place], [place]  [address] ° ° Phone: [phone]   Fax: [phone] ° ° °DIET: °• Drink plenty of non-alcoholic fluids & begin a light diet. °• Resume your normal diet the day after surgery. ° °ACTIVITY:  °• You may use crutches or a walker with weight-bearing as tolerated, unless instructed otherwise. °• You may wean yourself off of the walker or crutches as tolerated.  °• Begin doing gentle exercises. Exercising will reduce the pain and swelling, increase motion, and prevent muscle weakness.   °• Avoid strenuous activities or athletics for a minimum of 4-6 weeks after arthroscopic surgery. °• Do not drive or operate any equipment until instructed. ° °WOUND CARE:  °• Place one to two pillows under the knee the first day or two when sitting or lying.  °• Continue to use the ice packs periodically to reduce pain and swelling. °• The small incisions in your knee are closed with nylon stitches. The stitches will be removed in the office. °• The bulky dressing may be removed on the second day after surgery. DO NOT TOUCH THE STITCHES. Put a Band-Aid over each stitch. Do NOT use any ointments or creams on the incisions.  °• You may bathe or shower after the stitches are removed at the first office visit following surgery. ° °MEDICATIONS: °• You may resume your regular medications. °• Please take the pain medication as prescribed. °• Do not take pain medication on an empty stomach. °• Do not drive or drink alcoholic beverages when taking pain medications. ° °CALL THE OFFICE FOR: °• Temperature above 101 degrees °• Excessive bleeding or drainage on the dressing. °• Excessive swelling, coldness, or paleness of the toes. °• Persistent nausea and vomiting. ° °FOLLOW-UP:  °• You should have an appointment to return to the office in 7-10 days after surgery.  °  °

## 2019-06-05 ENCOUNTER — Encounter
Admission: RE | Admit: 2019-06-05 | Discharge: 2019-06-05 | Disposition: A | Discharge status: Home / Self Care | Payer: Medicare Other | Source: Ambulatory Visit | Attending: Orthopedic Surgery | Admitting: Orthopedic Surgery

## 2019-06-05 ENCOUNTER — Other Ambulatory Visit: Payer: Self-pay

## 2019-06-05 DIAGNOSIS — Z1159 Encounter for screening for other viral diseases: Secondary | ICD-10-CM | POA: Diagnosis not present

## 2019-06-05 DIAGNOSIS — R9431 Abnormal electrocardiogram [ECG] [EKG]: Secondary | ICD-10-CM | POA: Insufficient documentation

## 2019-06-05 DIAGNOSIS — I10 Essential (primary) hypertension: Secondary | ICD-10-CM | POA: Diagnosis not present

## 2019-06-05 DIAGNOSIS — Z01818 Encounter for other preprocedural examination: Secondary | ICD-10-CM | POA: Insufficient documentation

## 2019-06-05 LAB — CBC
HCT: 37.6 % (ref 36.0–46.0)
Hemoglobin: 12.5 g/dL (ref 12.0–15.0)
MCH: 29.6 pg (ref 26.0–34.0)
MCHC: 33.2 g/dL (ref 30.0–36.0)
MCV: 89.1 fL (ref 80.0–100.0)
Platelets: 283 10*3/uL (ref 150–400)
RBC: 4.22 MIL/uL (ref 3.87–5.11)
RDW: 13.8 % (ref 11.5–15.5)
WBC: 6.1 10*3/uL (ref 4.0–10.5)
nRBC: 0 % (ref 0.0–0.2)

## 2019-06-05 LAB — POTASSIUM: Potassium: 3.4 mmol/L — ABNORMAL LOW (ref 3.5–5.1)

## 2019-06-05 NOTE — Pre-Procedure Instructions (Signed)
G2 drink and incentive spirometry given with instructions.

## 2019-06-05 NOTE — Patient Instructions (Addendum)
Your procedure is scheduled on: 06/10/2019 Wed Report to Same Day Surgery 2nd floor medical mall [facility] Entrance-take elevator on left to 2nd floor.  Check in with surgery information desk.) To find out your arrival time please call [phone] between 1PM - 3PM on 06/09/2019 Tues  Remember: Instructions that are not followed completely may result in serious medical risk, up to and including death, or upon the discretion of your surgeon and anesthesiologist your surgery may need to be rescheduled.    _x___ 1. Do not eat food after midnight the night before your procedure. You may drink clear liquids up to 2 hours before you are scheduled to arrive at the hospital for your procedure.  Do not drink clear liquids within 2 hours of your scheduled arrival to the hospital.  Clear liquids include  --Water or Apple juice without pulp  --Clear carbohydrate beverage such as ClearFast or Gatorade  --Black Coffee or Clear Tea (No milk, no creamers, do not add anything to                  the coffee or Tea Type 1 and type 2 diabetics should only drink water.   ____Ensure clear carbohydrate drink on the way to the hospital for bariatric patients  ____Ensure clear carbohydrate drink 3 hours before surgery   No gum chewing or hard candies.     __x__ 2. No Alcohol for 24 hours before or after surgery.   __x__3. No Smoking or e-cigarettes for 24 prior to surgery.  Do not use any chewable tobacco products for at least 6 hour prior to surgery   ____  4. Bring all medications with you on the day of surgery if instructed.    __x__ 5. Notify your doctor if there is any change in your medical condition     (cold, fever, infections).    x___6. On the morning of surgery brush your teeth with toothpaste and water.  You may rinse your mouth with mouth wash if you wish.  Do not swallow any toothpaste or mouthwash.   Do not wear jewelry, make-up, hairpins, clips or nail polish.  Do not wear lotions,  powders, or perfumes. You may wear deodorant.  Do not shave 48 hours prior to surgery. Men may shave face and neck.  Do not bring valuables to the hospital.    [facility] is not responsible for any belongings or valuables.               Contacts, dentures or bridgework may not be worn into surgery.  Leave your suitcase in the car. After surgery it may be brought to your room.  For patients admitted to the hospital, discharge time is determined by your                       treatment team.  _  Patients discharged the day of surgery will not be allowed to drive home.  You will need someone to drive you home and stay with you the night of your procedure.    Please read over the following fact sheets that you were given:   [facility] Preparing for Surgery and or MRSA Information   _x___ Take anti-hypertensive listed below, cardiac, seizure, asthma,     anti-reflux and psychiatric medicines. These include:  1. hydrALAZINE (APRESOLINE  2.  3.  4.  5.  6.  ____Fleets enema or Magnesium Citrate as directed.   _x___ Use CHG Soap  or sage wipes as directed on instruction sheet   ____ Use inhalers on the day of surgery and bring to hospital day of surgery  _x___ Stop Metformin and Janumet 2 days prior to surgery.    _x___ Take 1/2 of usual insulin dose the night before surgery and none on the morning     surgery.   _x___ Follow recommendations from Cardiologist, Pulmonologist or PCP regarding          stopping Aspirin, Coumadin, Plavix ,Eliquis, Effient, or Pradaxa, and Pletal.  X____Stop Anti-inflammatories such as Advil, Aleve, Ibuprofen, Motrin, Naproxen, Naprosyn, Goodies powders or aspirin products. OK to take Tylenol and                          Celebrex.   _x___ Stop supplements until after surgery.  But may continue Vitamin D, Vitamin B,       and multivitamin.   ____ Bring C-Pap to the hospital.

## 2019-06-06 LAB — NOVEL CORONAVIRUS, NAA (HOSP ORDER, SEND-OUT TO REF LAB; TAT 18-24 HRS): SARS-CoV-2, NAA: NOT DETECTED

## 2019-06-10 ENCOUNTER — Encounter: Payer: Self-pay | Admitting: Orthopedic Surgery

## 2019-06-10 ENCOUNTER — Ambulatory Visit
Admission: RE | Admit: 2019-06-10 | Discharge: 2019-06-10 | Disposition: A | Discharge status: Home / Self Care | Payer: Medicare Other | Attending: Orthopedic Surgery | Admitting: Orthopedic Surgery

## 2019-06-10 ENCOUNTER — Ambulatory Visit: Payer: Medicare Other | Admitting: Anesthesiology

## 2019-06-10 ENCOUNTER — Encounter
Admission: RE | Disposition: A | Discharge status: Home / Self Care | Payer: Self-pay | Source: Home / Self Care | Attending: Orthopedic Surgery

## 2019-06-10 DIAGNOSIS — E119 Type 2 diabetes mellitus without complications: Secondary | ICD-10-CM | POA: Diagnosis not present

## 2019-06-10 DIAGNOSIS — I10 Essential (primary) hypertension: Secondary | ICD-10-CM | POA: Diagnosis not present

## 2019-06-10 DIAGNOSIS — Z79899 Other long term (current) drug therapy: Secondary | ICD-10-CM | POA: Diagnosis not present

## 2019-06-10 DIAGNOSIS — Z791 Long term (current) use of non-steroidal anti-inflammatories (NSAID): Secondary | ICD-10-CM | POA: Diagnosis not present

## 2019-06-10 DIAGNOSIS — Z6836 Body mass index (BMI) 36.0-36.9, adult: Secondary | ICD-10-CM | POA: Insufficient documentation

## 2019-06-10 DIAGNOSIS — M94262 Chondromalacia, left knee: Secondary | ICD-10-CM | POA: Diagnosis not present

## 2019-06-10 DIAGNOSIS — F329 Major depressive disorder, single episode, unspecified: Secondary | ICD-10-CM | POA: Insufficient documentation

## 2019-06-10 DIAGNOSIS — E669 Obesity, unspecified: Secondary | ICD-10-CM | POA: Insufficient documentation

## 2019-06-10 DIAGNOSIS — Z794 Long term (current) use of insulin: Secondary | ICD-10-CM | POA: Diagnosis not present

## 2019-06-10 DIAGNOSIS — M2392 Unspecified internal derangement of left knee: Secondary | ICD-10-CM | POA: Insufficient documentation

## 2019-06-10 DIAGNOSIS — M1990 Unspecified osteoarthritis, unspecified site: Secondary | ICD-10-CM | POA: Diagnosis not present

## 2019-06-10 DIAGNOSIS — F419 Anxiety disorder, unspecified: Secondary | ICD-10-CM | POA: Insufficient documentation

## 2019-06-10 DIAGNOSIS — Z9889 Other specified postprocedural states: Secondary | ICD-10-CM

## 2019-06-10 HISTORY — PX: KNEE ARTHROSCOPY: SHX127

## 2019-06-10 LAB — GLUCOSE, CAPILLARY
Glucose-Capillary: 179 mg/dL — ABNORMAL HIGH (ref 70–99)
Glucose-Capillary: 145 mg/dL — ABNORMAL HIGH (ref 70–99)

## 2019-06-10 SURGERY — ARTHROSCOPY, KNEE
Anesthesia: General | Site: Knee | Laterality: Left

## 2019-06-10 MED ORDER — BUPIVACAINE-EPINEPHRINE 0.25% -1:200000 IJ SOLN
INTRAMUSCULAR | Status: DC | PRN
  Administered 2019-06-10: 30 mL

## 2019-06-10 MED ORDER — MIDAZOLAM HCL 2 MG/2ML IJ SOLN
INTRAMUSCULAR | Status: DC | PRN
  Administered 2019-06-10: 2 mg via INTRAVENOUS

## 2019-06-10 MED ORDER — FAMOTIDINE 20 MG PO TABS
ORAL_TABLET | ORAL | Status: AC
  Filled 2019-06-10: qty 1

## 2019-06-10 MED ORDER — LIDOCAINE HCL (CARDIAC) PF 100 MG/5ML IV SOSY
PREFILLED_SYRINGE | INTRAVENOUS | Status: DC | PRN
  Administered 2019-06-10: 100 mg via INTRAVENOUS

## 2019-06-10 MED ORDER — DEXAMETHASONE SODIUM PHOSPHATE 10 MG/ML IJ SOLN
INTRAMUSCULAR | Status: DC | PRN
  Administered 2019-06-10: 10 mg via INTRAVENOUS

## 2019-06-10 MED ORDER — ACETAMINOPHEN 10 MG/ML IV SOLN
INTRAVENOUS | Status: DC | PRN
  Administered 2019-06-10: 1000 mg via INTRAVENOUS

## 2019-06-10 MED ORDER — PROPOFOL 10 MG/ML IV BOLUS
INTRAVENOUS | Status: AC
  Filled 2019-06-10: qty 40

## 2019-06-10 MED ORDER — ONDANSETRON HCL 4 MG/2ML IJ SOLN: 4.0000 mg | Freq: Four times a day (QID) | INTRAMUSCULAR | Status: DC | PRN

## 2019-06-10 MED ORDER — PROPOFOL 500 MG/50ML IV EMUL
INTRAVENOUS | Status: AC
  Filled 2019-06-10: qty 50

## 2019-06-10 MED ORDER — FENTANYL CITRATE (PF) 100 MCG/2ML IJ SOLN
INTRAMUSCULAR | Status: AC
  Filled 2019-06-10: qty 2

## 2019-06-10 MED ORDER — SODIUM CHLORIDE 0.9 % IV SOLN: INTRAVENOUS | Status: DC

## 2019-06-10 MED ORDER — ONDANSETRON HCL 4 MG PO TABS: 4.0000 mg | ORAL_TABLET | Freq: Four times a day (QID) | ORAL | Status: DC | PRN

## 2019-06-10 MED ORDER — ROCURONIUM BROMIDE 100 MG/10ML IV SOLN
INTRAVENOUS | Status: DC | PRN
  Administered 2019-06-10: 5 mg via INTRAVENOUS
  Administered 2019-06-10: 45 mg via INTRAVENOUS

## 2019-06-10 MED ORDER — FENTANYL CITRATE (PF) 100 MCG/2ML IJ SOLN
INTRAMUSCULAR | Status: DC | PRN
  Administered 2019-06-10 (×2): 25 ug via INTRAVENOUS
  Administered 2019-06-10: 50 ug via INTRAVENOUS

## 2019-06-10 MED ORDER — MEPERIDINE HCL 50 MG/ML IJ SOLN: 6.2500 mg | INTRAMUSCULAR | Status: DC | PRN

## 2019-06-10 MED ORDER — SUGAMMADEX SODIUM 200 MG/2ML IV SOLN
INTRAVENOUS | Status: DC | PRN
  Administered 2019-06-10: 200 mg via INTRAVENOUS

## 2019-06-10 MED ORDER — BUPIVACAINE HCL (PF) 0.25 % IJ SOLN
INTRAMUSCULAR | Status: AC
  Filled 2019-06-10: qty 30

## 2019-06-10 MED ORDER — OXYCODONE HCL 5 MG PO TABS
ORAL_TABLET | ORAL | Status: AC
  Filled 2019-06-10: qty 1

## 2019-06-10 MED ORDER — MORPHINE SULFATE 4 MG/ML IJ SOLN
INTRAMUSCULAR | Status: DC | PRN
  Administered 2019-06-10: 4 mg via INTRAVENOUS

## 2019-06-10 MED ORDER — PROMETHAZINE HCL 25 MG/ML IJ SOLN: 6.2500 mg | INTRAMUSCULAR | Status: DC | PRN

## 2019-06-10 MED ORDER — MORPHINE SULFATE (PF) 4 MG/ML IV SOLN
INTRAVENOUS | Status: AC
  Filled 2019-06-10: qty 1

## 2019-06-10 MED ORDER — SCOPOLAMINE 1 MG/3DAYS TD PT72
MEDICATED_PATCH | TRANSDERMAL | Status: AC
  Filled 2019-06-10: qty 1

## 2019-06-10 MED ORDER — ACETAMINOPHEN 10 MG/ML IV SOLN
INTRAVENOUS | Status: AC
  Filled 2019-06-10: qty 100

## 2019-06-10 MED ORDER — SUCCINYLCHOLINE CHLORIDE 20 MG/ML IJ SOLN
INTRAMUSCULAR | Status: DC | PRN
  Administered 2019-06-10: 120 mg via INTRAVENOUS

## 2019-06-10 MED ORDER — PHENYLEPHRINE HCL (PRESSORS) 10 MG/ML IV SOLN
INTRAVENOUS | Status: DC | PRN
  Administered 2019-06-10 (×4): 100 ug via INTRAVENOUS

## 2019-06-10 MED ORDER — PROPOFOL 500 MG/50ML IV EMUL
INTRAVENOUS | Status: DC | PRN
  Administered 2019-06-10: 150 ug/kg/min via INTRAVENOUS
  Administered 2019-06-10: 75 ug/kg/min via INTRAVENOUS

## 2019-06-10 MED ORDER — SODIUM CHLORIDE 0.9 % IV SOLN
INTRAVENOUS | Status: DC
  Administered 2019-06-10: 14:00:00 via INTRAVENOUS

## 2019-06-10 MED ORDER — CHLORHEXIDINE GLUCONATE 4 % EX LIQD
60.0000 mL | Freq: Once | CUTANEOUS | Status: AC
  Administered 2019-06-10: 4 via TOPICAL

## 2019-06-10 MED ORDER — ONDANSETRON HCL 4 MG/2ML IJ SOLN
INTRAMUSCULAR | Status: DC | PRN
  Administered 2019-06-10: 4 mg via INTRAVENOUS

## 2019-06-10 MED ORDER — HYDROCODONE-ACETAMINOPHEN 5-325 MG PO TABS: 1.0000 | ORAL_TABLET | ORAL | 0 refills | Status: DC | PRN

## 2019-06-10 MED ORDER — DEXAMETHASONE SODIUM PHOSPHATE 10 MG/ML IJ SOLN
INTRAMUSCULAR | Status: AC
  Filled 2019-06-10: qty 1

## 2019-06-10 MED ORDER — OXYCODONE HCL 5 MG PO TABS
5.0000 mg | ORAL_TABLET | Freq: Once | ORAL | Status: AC | PRN
  Administered 2019-06-10: 5 mg via ORAL

## 2019-06-10 MED ORDER — METOCLOPRAMIDE HCL 10 MG PO TABS: 5.0000 mg | ORAL_TABLET | Freq: Three times a day (TID) | ORAL | Status: DC | PRN

## 2019-06-10 MED ORDER — FAMOTIDINE 20 MG PO TABS
20.0000 mg | ORAL_TABLET | Freq: Once | ORAL | Status: AC
  Administered 2019-06-10: 20 mg via ORAL

## 2019-06-10 MED ORDER — SCOPOLAMINE 1 MG/3DAYS TD PT72
1.0000 | MEDICATED_PATCH | TRANSDERMAL | Status: DC
  Administered 2019-06-10: 1.5 mg via TRANSDERMAL

## 2019-06-10 MED ORDER — MIDAZOLAM HCL 2 MG/2ML IJ SOLN
INTRAMUSCULAR | Status: AC
  Filled 2019-06-10: qty 2

## 2019-06-10 MED ORDER — SUGAMMADEX SODIUM 200 MG/2ML IV SOLN
INTRAVENOUS | Status: AC
  Filled 2019-06-10: qty 2

## 2019-06-10 MED ORDER — PROPOFOL 10 MG/ML IV BOLUS
INTRAVENOUS | Status: DC | PRN
  Administered 2019-06-10: 40 mg via INTRAVENOUS
  Administered 2019-06-10 (×2): 50 mg via INTRAVENOUS
  Administered 2019-06-10: 150 mg via INTRAVENOUS
  Administered 2019-06-10: 50 mg via INTRAVENOUS

## 2019-06-10 MED ORDER — METOCLOPRAMIDE HCL 5 MG/ML IJ SOLN: 5.0000 mg | Freq: Three times a day (TID) | INTRAMUSCULAR | Status: DC | PRN

## 2019-06-10 MED ORDER — OXYCODONE HCL 5 MG/5ML PO SOLN: 5.0000 mg | Freq: Once | ORAL | Status: AC | PRN

## 2019-06-10 MED ORDER — FENTANYL CITRATE (PF) 100 MCG/2ML IJ SOLN: 25.0000 ug | INTRAMUSCULAR | Status: DC | PRN

## 2019-06-10 SURGICAL SUPPLY — 25 items
BLADE SHAVER 4.5 DBL SERAT CV (CUTTER) IMPLANT
COVER WAND RF STERILE (DRAPES) ×2 IMPLANT
CUFF TOURN SGL QUICK 24 (TOURNIQUET CUFF)
CUFF TOURN SGL QUICK 30 (TOURNIQUET CUFF)
CUFF TRNQT CYL 24X4X16.5-23 (TOURNIQUET CUFF) IMPLANT
CUFF TRNQT CYL 30X4X21-28X (TOURNIQUET CUFF) IMPLANT
DRSG DERMACEA 8X12 NADH (GAUZE/BANDAGES/DRESSINGS) ×2 IMPLANT
DURAPREP 26ML APPLICATOR (WOUND CARE) ×4 IMPLANT
GAUZE SPONGE 4X4 12PLY STRL (GAUZE/BANDAGES/DRESSINGS) ×2 IMPLANT
GLOVE BIOGEL M STRL SZ7.5 (GLOVE) ×2 IMPLANT
GLOVE INDICATOR 8.0 STRL GRN (GLOVE) ×2 IMPLANT
GOWN STRL REUS W/ TWL LRG LVL3 (GOWN DISPOSABLE) ×2 IMPLANT
GOWN STRL REUS W/TWL LRG LVL3 (GOWN DISPOSABLE) ×2
IV LACTATED RINGER IRRG 3000ML (IV SOLUTION) ×6
IV LR IRRIG 3000ML ARTHROMATIC (IV SOLUTION) ×6 IMPLANT
KIT TURNOVER KIT A (KITS) ×2 IMPLANT
MANIFOLD NEPTUNE II (INSTRUMENTS) ×2 IMPLANT
PACK ARTHROSCOPY KNEE (MISCELLANEOUS) ×2 IMPLANT
SET TUBE SUCT SHAVER OUTFL 24K (TUBING) ×2 IMPLANT
SET TUBE TIP INTRA-ARTICULAR (MISCELLANEOUS) ×2 IMPLANT
SUT ETHILON 3-0 FS-10 30 BLK (SUTURE) ×2
SUTURE EHLN 3-0 FS-10 30 BLK (SUTURE) ×1 IMPLANT
TUBING ARTHRO INFLOW-ONLY STRL (TUBING) ×2 IMPLANT
WAND HAND CNTRL MULTIVAC 50 (MISCELLANEOUS) ×2 IMPLANT
WRAP KNEE W/COLD PACKS 25.5X14 (SOFTGOODS) ×2 IMPLANT

## 2019-06-10 NOTE — Anesthesia Post-op Follow-up Note (Signed)
Anesthesia QCDR form completed.        

## 2019-06-10 NOTE — Transfer of Care (Signed)
Immediate Anesthesia Transfer of Care Note  Patient: Julia Cooper  Procedure(s) Performed: ARTHROSCOPY KNEE LEFT MEDIAL MENSIECTOMY, CHONDROPLASTY (Left Knee)  Patient Location: PACU  Anesthesia Type:General  Level of Consciousness: sedated and patient cooperative  Airway & Oxygen Therapy: Patient Spontanous Breathing and Patient connected to face mask oxygen  Post-op Assessment: Report given to RN and Post -op Vital signs reviewed and stable  Post vital signs: Reviewed and stable  Last Vitals:  Vitals Value Taken Time  BP 134/71 06/10/19 1838  Temp 36.6 C 06/10/19 1838  Pulse 90 06/10/19 1843  Resp 20 06/10/19 1843  SpO2 99 % 06/10/19 1843  Vitals shown include unvalidated device data.  Last Pain:  Vitals:   06/10/19 1838  TempSrc:   PainSc: Asleep         Complications: No apparent anesthesia complications

## 2019-06-10 NOTE — Anesthesia Preprocedure Evaluation (Signed)
Anesthesia Evaluation  Patient identified by MRN, date of birth, ID band Patient awake    Reviewed: Allergy & Precautions, NPO status , Patient's Chart, lab work & pertinent test results  History of Anesthesia Complications (+) PONV and history of anesthetic complications  Airway Mallampati: III  TM Distance: >3 FB Neck ROM: Full    Dental no notable dental hx.    Pulmonary neg pulmonary ROS, neg sleep apnea, neg COPD,    breath sounds clear to auscultation- rhonchi (-) wheezing      Cardiovascular hypertension, Pt. on medications (-) CAD, (-) Past MI, (-) Cardiac Stents and (-) CABG  Rhythm:Regular Rate:Normal - Systolic murmurs and - Diastolic murmurs    Neuro/Psych neg Seizures PSYCHIATRIC DISORDERS Anxiety Depression negative neurological ROS     GI/Hepatic negative GI ROS, Neg liver ROS,   Endo/Other  diabetes, Insulin Dependent  Renal/GU negative Renal ROS     Musculoskeletal  (+) Arthritis ,   Abdominal (+) + obese,   Peds  Hematology negative hematology ROS (+)   Anesthesia Other Findings Past Medical History: No date: Anxiety No date: Arthritis No date: Diabetes mellitus without complication (HCC) No date: Family history of adverse reaction to anesthesia     Comment:  father  N/V No date: Hypertension No date: PONV (postoperative nausea and vomiting) No date: Tongue burning sensation   Reproductive/Obstetrics                             Anesthesia Physical Anesthesia Plan  ASA: III  Anesthesia Plan: General   Post-op Pain Management:    Induction: Intravenous  PONV Risk Score and Plan: 3 and Ondansetron, Midazolam, Scopolamine patch - Pre-op, Propofol infusion and Dexamethasone  Airway Management Planned: Oral ETT  Additional Equipment:   Intra-op Plan:   Post-operative Plan: Extubation in OR  Informed Consent: I have reviewed the patients History and  Physical, chart, labs and discussed the procedure including the risks, benefits and alternatives for the proposed anesthesia with the patient or authorized representative who has indicated his/her understanding and acceptance.     Dental advisory given  Plan Discussed with: CRNA and Anesthesiologist  Anesthesia Plan Comments:         Anesthesia Quick Evaluation

## 2019-06-10 NOTE — H&P (Signed)
The patient has been re-examined, and the chart reviewed, and there have been no interval changes to the documented history and physical.    The risks, benefits, and alternatives have been discussed at length. The patient expressed understanding of the risks benefits and agreed with plans for surgical intervention.  Julia Cooper, Jr. M.D.    

## 2019-06-10 NOTE — Anesthesia Postprocedure Evaluation (Signed)
Anesthesia Post Note  Patient: Julia Cooper  Procedure(s) Performed: ARTHROSCOPY KNEE LEFT MEDIAL MENSIECTOMY, CHONDROPLASTY (Left Knee)  Patient location during evaluation: PACU Anesthesia Type: General Level of consciousness: awake and alert Pain management: pain level controlled Vital Signs Assessment: post-procedure vital signs reviewed and stable Respiratory status: spontaneous breathing and respiratory function stable Cardiovascular status: stable Anesthetic complications: no     Last Vitals:  Vitals:   06/10/19 1355 06/10/19 1838  BP: 135/73 134/71  Pulse: 89 92  Resp: 14 (!) 22  Temp: 36.9 C 36.6 C  SpO2: 95% 98%    Last Pain:  Vitals:   06/10/19 1838  TempSrc:   PainSc: Asleep                 KEPHART,WILLIAM K

## 2019-06-10 NOTE — Anesthesia Procedure Notes (Signed)
Procedure Name: LMA Insertion Date/Time: 06/10/2019 5:05 PM Performed by: Caryl Asp, CRNA Pre-anesthesia Checklist: Patient identified, Patient being monitored, Timeout performed, Emergency Drugs available and Suction available Patient Re-evaluated:Patient Re-evaluated prior to induction Oxygen Delivery Method: Circle system utilized Preoxygenation: Pre-oxygenation with 100% oxygen Induction Type: IV induction Ventilation: Mask ventilation with difficulty, Two handed mask ventilation required and Oral airway inserted - appropriate to patient size Laryngoscope Size: McGraph and 3 Grade View: Grade II Tube type: Oral Tube size: 6.5 mm Number of attempts: 3 Airway Equipment and Method: Stylet Placement Confirmation: positive ETCO2 and breath sounds checked- equal and bilateral Secured at: 22 cm Tube secured with: Tape Dental Injury: Teeth and Oropharynx as per pre-operative assessment  Difficulty Due To: Difficulty was unanticipated Comments: Intubations with 7.0 tube were unsuccessful. 6.5 tube used and passed through the cords easily.

## 2019-06-10 NOTE — Op Note (Signed)
OPERATIVE NOTE  DATE OF SURGERY:  06/10/2019  PATIENT NAME:  Julia Cooper   DOB: August 04, 1951  MRN: [id number]   PRE-OPERATIVE DIAGNOSIS:  Internal derangement of the left knee   POST-OPERATIVE DIAGNOSIS:   Tear of the posterior horn medial meniscus, left knee Grade III chondromalacia medial femoral condyle, left knee  PROCEDURE:  Left knee arthroscopy, partial medial meniscectomy, and chondroplasty  SURGEON:  Marciano Sequin., M.D.   ASSISTANT: none  ANESTHESIA: general  ESTIMATED BLOOD LOSS: Minimal  FLUIDS REPLACED: 800 mL of crystalloid  TOURNIQUET TIME: Not used  INDICATIONS FOR SURGERY: Julia Cooper is a 68 y.o. year old female who has been seen for complaints of left knee pain. MRI demonstrated findings consistent with meniscal pathology. After discussion of the risks and benefits of surgical intervention, the patient expressed understanding of the risks benefits and agree with plans for left knee arthroscopy.   PROCEDURE IN DETAIL: The patient was brought into the operating room and, after adequate general anesthesia was achieved, a tourniquet was applied to the left thigh and the leg was placed in the leg holder. All bony prominences were well padded. The patient's left knee was cleaned and prepped with alcohol and Duraprep and draped in the usual sterile fashion. A "timeout" was performed as per usual protocol. The anticipated portal sites were injected with 0.25% Marcaine with epinephrine. An anterolateral incision was made and a cannula was inserted. A large effusion was evacuated and the knee was distended with fluid using the pump. The scope was advanced down the medial gutter into the medial compartment. Under visualization with the scope, an anteromedial portal was created and a hooked probe was inserted. The medial meniscus was visualized and probed.  Radial tear of the posterior horn of the medial meniscus with some extension.  The tear was debrided using  meniscal punches and a 4.5 mm incisor shaver.  Final contouring was performed using a 50 degree ArthroCare wand.  The remaining rim of meniscus was visualized and probed and felt to be stable.  The articular cartilage was visualized.  There were areas of grade III chondromalacia to the medial femoral condyle.  These areas were debrided and contoured using the ArthroCare wand.  The scope was then advanced into the intercondylar notch. The anterior cruciate ligament was visualized and probed and felt to be intact. The scope was removed from the lateral portal and reinserted via the anteromedial portal to better visualize the lateral compartment. The lateral meniscus was visualized and probed.  The lateral meniscus was stable.  The articular cartilage of the lateral compartment was visualized.  Mild grade 2 changes were appreciated to the lateral compartment.  Finally, the scope was advanced so as to visualize the patellofemoral articulation. Good patellar tracking was appreciated.  Some inflamed synovial tissue was encountered and this was debrided using the ArthroCare wand.  The knee was irrigated with copius amounts of fluid and suctioned dry. The anterolateral portal was re-approximated with #3-0 nylon. A combination of 0.25% Marcaine with epinephrine and 4 mg of Morphine were injected via the scope. The scope was removed and the anteromedial portal was re-approximated with #3-0 nylon. A sterile dressing was applied followed by application of an ice wrap.  The patient tolerated the procedure well and was transported to the PACU in stable condition.  James P. Holley Bouche., M.D.

## 2019-06-11 ENCOUNTER — Encounter: Payer: Self-pay | Admitting: Orthopedic Surgery

## 2020-01-13 ENCOUNTER — Ambulatory Visit: Payer: Medicare Other

## 2020-01-21 ENCOUNTER — Ambulatory Visit: Payer: Medicare Other | Attending: Internal Medicine

## 2020-01-21 DIAGNOSIS — Z23 Encounter for immunization: Secondary | ICD-10-CM | POA: Insufficient documentation

## 2020-01-21 NOTE — Progress Notes (Signed)
   Covid-19 Vaccination Clinic  Name:  Julia Cooper    MRN: [id number] DOB: [birth year]  01/21/2020  [name] was observed post Covid-19 immunization for 15 minutes without incidence. She was provided with Vaccine Information Sheet and instruction to access the V-Safe system.   [name] was instructed to call 911 with any severe reactions post vaccine: Marland Kitchen Difficulty breathing  . Swelling of your face and throat  . A fast heartbeat  . A bad rash all over your body  . Dizziness and weakness    Immunizations Administered    Name Date Dose VIS Date Route   Pfizer COVID-19 Vaccine 01/21/2020  1:32 PM 0.3 mL 11/27/2019 Intramuscular   Manufacturer: [place]   Lot: [id number]   [place]: [id number]

## 2020-01-28 ENCOUNTER — Other Ambulatory Visit: Payer: Self-pay | Admitting: Obstetrics & Gynecology

## 2020-01-28 DIAGNOSIS — Z1231 Encounter for screening mammogram for malignant neoplasm of breast: Secondary | ICD-10-CM

## 2020-02-14 DIAGNOSIS — M1712 Unilateral primary osteoarthritis, left knee: Secondary | ICD-10-CM | POA: Insufficient documentation

## 2020-02-15 ENCOUNTER — Ambulatory Visit: Payer: Medicare Other | Attending: Internal Medicine

## 2020-02-15 DIAGNOSIS — Z23 Encounter for immunization: Secondary | ICD-10-CM | POA: Insufficient documentation

## 2020-02-15 NOTE — Progress Notes (Signed)
   Covid-19 Vaccination Clinic  Name:  Julia Cooper    MRN: [id number] DOB: 1951-01-21  02/15/2020  Julia Cooper was observed post Covid-19 immunization for 15 minutes without incidence. She was provided with Vaccine Information Sheet and instruction to access the V-Safe system.   Julia Cooper was instructed to call 911 with any severe reactions post vaccine: Marland Kitchen Difficulty breathing  . Swelling of your face and throat  . A fast heartbeat  . A bad rash all over your body  . Dizziness and weakness    Immunizations Administered    Name Date Dose VIS Date Route   Pfizer COVID-19 Vaccine 02/15/2020  4:00 PM 0.3 mL 11/27/2019 Intramuscular   Manufacturer: [place]   Lot: [id number]   [place]: [id number]

## 2020-03-01 ENCOUNTER — Ambulatory Visit
Admission: RE | Admit: 2020-03-01 | Discharge: 2020-03-01 | Disposition: A | Discharge status: Home / Self Care | Payer: Medicare Other | Source: Ambulatory Visit | Attending: Obstetrics & Gynecology | Admitting: Obstetrics & Gynecology

## 2020-03-01 DIAGNOSIS — Z1231 Encounter for screening mammogram for malignant neoplasm of breast: Secondary | ICD-10-CM | POA: Insufficient documentation

## 2020-05-08 NOTE — Discharge Instructions (Signed)
Instructions after Total Knee Replacement   Kamla Skilton P. Dontrae Morini, Jr., M.D.     Dept. of Orthopaedics & Sports Medicine  [facility]  [address]  [place], [place]  [address]  Phone: [phone]   Fax: [phone]    DIET: Drink plenty of non-alcoholic fluids. Resume your normal diet. Include foods high in fiber.  ACTIVITY:  You may use crutches or a walker with weight-bearing as tolerated, unless instructed otherwise. You may be weaned off of the walker or crutches by your Physical Therapist.  Do NOT place pillows under the knee. Anything placed under the knee could limit your ability to straighten the knee.   Continue doing gentle exercises. Exercising will reduce the pain and swelling, increase motion, and prevent muscle weakness.   Please continue to use the TED compression stockings for 6 weeks. You may remove the stockings at night, but should reapply them in the morning. Do not drive or operate any equipment until instructed.  WOUND CARE:  Continue to use the PolarCare or ice packs periodically to reduce pain and swelling. You may bathe or shower after the staples are removed at the first office visit following surgery.  MEDICATIONS: You may resume your regular medications. Please take the pain medication as prescribed on the medication. Do not take pain medication on an empty stomach. You have been given a prescription for a blood thinner (Lovenox or Coumadin). Please take the medication as instructed. (NOTE: After completing a 2 week course of Lovenox, take one Enteric-coated aspirin once a day. This along with elevation will help reduce the possibility of phlebitis in your operated leg.) Do not drive or drink alcoholic beverages when taking pain medications.  CALL THE OFFICE FOR: Temperature above 101 degrees Excessive bleeding or drainage on the dressing. Excessive swelling, coldness, or paleness of the toes. Persistent nausea and vomiting.  FOLLOW-UP:  You  should have an appointment to return to the office in 10-14 days after surgery. Arrangements have been made for continuation of Physical Therapy (either home therapy or outpatient therapy).   [facility] Department Directory         [email or web address]       [email or web address]          Cardiology  Appointments: [place] - [phone] [place] - [phone]  Endocrinology  Appointments: [place] - [phone] [place] - [phone]  Gastroenterology  Appointments: [place] - [phone] [place] - [phone]        General Surgery   Appointments: [place] - [phone]  Internal Medicine/Family Medicine  Appointments: [place] - [phone] [place] - [phone] [place] - [phone]  Metabolic and Weigh Loss Surgery  Appointments: [place] - [phone]        Neurology  Appointments: [place] - [phone] [place] - [phone]  Neurosurgery  Appointments: [place] - [phone]  Obstetrics & Gynecology  Appointments: [place] - [phone] [place] - [phone]        Pediatrics  Appointments: [place] - [phone] [place] - [phone]  Physiatry  Appointments: [place] -[phone]  Physical Therapy  Appointments: [place] - [phone] [place] - [phone]        Podiatry  Appointments: [place] - [phone] [place] - [phone]  Pulmonology  Appointments: [place] - [phone]  Rheumatology  Appointments: [place] - [phone]         Location: [facility]  [address] , [place]  [address]  [place] Location: [facility] [address] [place], [place]  [address]  [place] Location: [facility] [address] [place], [place]  [address]    

## 2020-05-18 ENCOUNTER — Other Ambulatory Visit: Payer: Self-pay

## 2020-05-18 ENCOUNTER — Encounter
Admission: RE | Admit: 2020-05-18 | Discharge: 2020-05-18 | Disposition: A | Discharge status: Home / Self Care | Payer: Medicare Other | Source: Ambulatory Visit | Attending: Orthopedic Surgery | Admitting: Orthopedic Surgery

## 2020-05-18 DIAGNOSIS — Z01818 Encounter for other preprocedural examination: Secondary | ICD-10-CM | POA: Insufficient documentation

## 2020-05-18 HISTORY — DX: Gastro-esophageal reflux disease without esophagitis: K21.9

## 2020-05-18 HISTORY — DX: Malignant (primary) neoplasm, unspecified: C80.1

## 2020-05-18 NOTE — Patient Instructions (Addendum)
Your procedure is scheduled on: 05-30-20 [facility] Report to Same Day Surgery 2nd floor medical mall [facility] Entrance-take elevator on left to 2nd floor.  Check in with surgery information desk.) To find out your arrival time please call [phone] between 1PM - 3PM on 05-27-20 FRIDAY  Remember: Instructions that are not followed completely may result in serious medical risk, up to and including death, or upon the discretion of your surgeon and anesthesiologist your surgery may need to be rescheduled.    _x___ 1. Do not eat food after midnight the night before your procedure. NO GUM OR CANDY AFTER MIDNIGHT. You may drink WATER up to 2 hours before you are scheduled to arrive at the hospital for your procedure.  Do not drink WATER within 2 hours of your scheduled arrival to the hospital.  Type 1 and type 2 diabetics should only drink water.   ____Ensure clear carbohydrate drink on the way to the hospital for bariatric patients  _X___GATORADE G2-FINISH DRINK 2 HOURS PRIOR TO [place]     __x__ 2. No Alcohol for 24 hours before or after surgery.   __x__3. No Smoking or e-cigarettes for 24 prior to surgery.  Do not use any chewable tobacco products for at least 6 hour prior to surgery   ____  4. Bring all medications with you on the day of surgery if instructed.    __x__ 5. Notify your doctor if there is any change in your medical condition     (cold, fever, infections).    x___6. On the morning of surgery brush your teeth with toothpaste and water.  You may rinse your mouth with mouth wash if you wish.  Do not swallow any toothpaste or mouthwash.   Do not wear jewelry, make-up, hairpins, clips or nail polish.  Do not wear lotions, powders, or perfumes.   Do not shave 48 hours prior to surgery. Men may shave face and neck.  Do not bring valuables to the hospital.    [facility] is not responsible for any belongings or valuables.    Contacts, dentures or bridgework may not be worn into surgery.  Leave your suitcase in the car. After surgery it may be brought to your room.  For patients admitted to the hospital, discharge time is determined by your treatment team.  _  Patients discharged the day of surgery will not be allowed to drive home.  You will need someone to drive you home and stay with you the night of your procedure.    Please read over the following fact sheets that you were given:   [facility] Preparing for Surgery and MRSA Information/INCENTIVE SPIROMETER INSTRUCTIONS  _x___ TAKE THE FOLLOWING MEDICATION THE MORNING OF SURGERY WITH A SMALL SIP OF WATER. These include:  1. HYDRALAZINE (APRESOLINE)  2.  3.  4.  5.  6.  ____Fleets enema or Magnesium Citrate as directed.   _x___ Use CHG Soap or sage wipes as directed on instruction sheet   ____ Use inhalers on the day of surgery and bring to hospital day of surgery  _X___ Stop Metformin 2 days prior to surgery-LAST DOSE ON Friday 6-11   _X___ Take 1/2 of usual insulin dose the night before surgery and none on the morning surgery-TAKE HALF OF YOUR HUMULIN INSULIN Sunday NIGHT (19 UNITS)  ____ Follow recommendations from Cardiologist, Pulmonologist or PCP regarding stopping Aspirin, Coumadin, Plavix ,Eliquis, Effient, or Pradaxa, and Pletal.  X____Stop Anti-inflammatories such as  Advil, Aleve, Ibuprofen, Motrin, Naproxen, MOBIC (MELOXICAM) Naprosyn, Goodies powders or aspirin products 7 DAYS PRIOR TO SURGERY-OK to take Tylenol    ____ Stop supplements until after surgery.    ____ Bring C-Pap to the hospital.

## 2020-05-20 ENCOUNTER — Other Ambulatory Visit: Payer: Self-pay

## 2020-05-20 ENCOUNTER — Encounter
Admission: RE | Admit: 2020-05-20 | Discharge: 2020-05-20 | Disposition: A | Discharge status: Home / Self Care | Payer: Medicare Other | Source: Ambulatory Visit | Attending: Orthopedic Surgery | Admitting: Orthopedic Surgery

## 2020-05-23 ENCOUNTER — Encounter
Admission: RE | Admit: 2020-05-23 | Discharge: 2020-05-23 | Disposition: A | Discharge status: Home / Self Care | Payer: Medicare Other | Source: Ambulatory Visit | Attending: Orthopedic Surgery | Admitting: Orthopedic Surgery

## 2020-05-23 ENCOUNTER — Other Ambulatory Visit: Payer: Self-pay

## 2020-05-23 DIAGNOSIS — Z01818 Encounter for other preprocedural examination: Secondary | ICD-10-CM | POA: Insufficient documentation

## 2020-05-23 LAB — C-REACTIVE PROTEIN: CRP: 1 mg/dL — ABNORMAL HIGH (ref <1.0)

## 2020-05-23 LAB — COMPREHENSIVE METABOLIC PANEL
ALT: 27 U/L (ref 0–44)
AST: 25 U/L (ref 15–41)
Albumin: 4 g/dL (ref 3.5–5.0)
Alkaline Phosphatase: 65 U/L (ref 38–126)
Anion gap: 12 (ref 5–15)
BUN: 9 mg/dL (ref 8–23)
CO2: 27 mmol/L (ref 22–32)
Calcium: 8.9 mg/dL (ref 8.9–10.3)
Chloride: 90 mmol/L — ABNORMAL LOW (ref 98–111)
Creatinine, Ser: 0.72 mg/dL (ref 0.44–1.00)
GFR calc Af Amer: >60 mL/min (ref >60)
GFR calc non Af Amer: >60 mL/min (ref >60)
Glucose, Bld: 192 mg/dL — ABNORMAL HIGH (ref 70–99)
Potassium: 3.4 mmol/L — ABNORMAL LOW (ref 3.5–5.1)
Sodium: 129 mmol/L — ABNORMAL LOW (ref 135–145)
Total Bilirubin: 1.3 mg/dL — ABNORMAL HIGH (ref 0.3–1.2)
Total Protein: 6.8 g/dL (ref 6.5–8.1)

## 2020-05-23 LAB — CBC
HCT: 37.5 % (ref 36.0–46.0)
Hemoglobin: 13 g/dL (ref 12.0–15.0)
MCH: 30 pg (ref 26.0–34.0)
MCHC: 34.7 g/dL (ref 30.0–36.0)
MCV: 86.6 fL (ref 80.0–100.0)
Platelets: 294 10*3/uL (ref 150–400)
RBC: 4.33 MIL/uL (ref 3.87–5.11)
RDW: 13.3 % (ref 11.5–15.5)
WBC: 6.3 10*3/uL (ref 4.0–10.5)
nRBC: 0 % (ref 0.0–0.2)

## 2020-05-23 LAB — APTT: aPTT: 32 seconds (ref 24–36)

## 2020-05-23 LAB — URINALYSIS, ROUTINE W REFLEX MICROSCOPIC
Bilirubin Urine: NEGATIVE
Glucose, UA: 50 mg/dL — AB
Hgb urine dipstick: NEGATIVE
Ketones, ur: NEGATIVE mg/dL
Leukocytes,Ua: NEGATIVE
Nitrite: NEGATIVE
Protein, ur: NEGATIVE mg/dL
Specific Gravity, Urine: 1.009 (ref 1.005–1.030)
pH: 7 (ref 5.0–8.0)

## 2020-05-23 LAB — HEMOGLOBIN A1C
Hgb A1c MFr Bld: 8.3 % — ABNORMAL HIGH (ref 4.8–5.6)
Mean Plasma Glucose: 191.51 mg/dL

## 2020-05-23 LAB — PROTIME-INR
INR: 0.9 (ref 0.8–1.2)
Prothrombin Time: 11.9 seconds (ref 11.4–15.2)

## 2020-05-23 LAB — SURGICAL PCR SCREEN
MRSA, PCR: NEGATIVE
Staphylococcus aureus: NEGATIVE

## 2020-05-23 LAB — SEDIMENTATION RATE: Sed Rate: 14 mm/hr (ref 0–30)

## 2020-05-24 LAB — URINE CULTURE
Culture: NO GROWTH
Special Requests: NORMAL

## 2020-05-24 LAB — TYPE AND SCREEN
ABO/RH(D): O POS
Antibody Screen: NEGATIVE

## 2020-05-26 ENCOUNTER — Other Ambulatory Visit: Admission: RE | Admit: 2020-05-26 | Payer: Medicare Other | Source: Ambulatory Visit

## 2020-05-30 ENCOUNTER — Encounter: Admission: RE | Payer: Self-pay | Source: Home / Self Care

## 2020-05-30 ENCOUNTER — Inpatient Hospital Stay: Admission: RE | Admit: 2020-05-30 | Payer: Medicare Other | Source: Home / Self Care | Admitting: Orthopedic Surgery

## 2020-05-30 SURGERY — ARTHROPLASTY, KNEE, TOTAL, USING IMAGELESS COMPUTER-ASSISTED NAVIGATION
Anesthesia: Choice | Site: Knee | Laterality: Left

## 2020-07-25 ENCOUNTER — Other Ambulatory Visit: Payer: Self-pay

## 2020-07-25 ENCOUNTER — Other Ambulatory Visit
Admission: RE | Admit: 2020-07-25 | Discharge: 2020-07-25 | Disposition: A | Discharge status: Home / Self Care | Payer: Medicare Other | Source: Ambulatory Visit | Attending: Orthopedic Surgery | Admitting: Orthopedic Surgery

## 2020-07-25 DIAGNOSIS — Z20822 Contact with and (suspected) exposure to covid-19: Secondary | ICD-10-CM | POA: Insufficient documentation

## 2020-07-25 DIAGNOSIS — Z01812 Encounter for preprocedural laboratory examination: Secondary | ICD-10-CM | POA: Insufficient documentation

## 2020-07-25 LAB — BASIC METABOLIC PANEL
Anion gap: 11 (ref 5–15)
BUN: 11 mg/dL (ref 8–23)
CO2: 27 mmol/L (ref 22–32)
Calcium: 8.7 mg/dL — ABNORMAL LOW (ref 8.9–10.3)
Chloride: 92 mmol/L — ABNORMAL LOW (ref 98–111)
Creatinine, Ser: 0.7 mg/dL (ref 0.44–1.00)
GFR calc Af Amer: >60 mL/min (ref >60)
GFR calc non Af Amer: >60 mL/min (ref >60)
Glucose, Bld: 208 mg/dL — ABNORMAL HIGH (ref 70–99)
Potassium: 3.9 mmol/L (ref 3.5–5.1)
Sodium: 130 mmol/L — ABNORMAL LOW (ref 135–145)

## 2020-07-25 LAB — TYPE AND SCREEN
ABO/RH(D): O POS
Antibody Screen: NEGATIVE

## 2020-07-25 NOTE — Discharge Instructions (Signed)
Instructions after Total Knee Replacement   Julia Cooper, Jr., M.D.     Dept. of Orthopaedics & Sports Medicine  [facility]  [address]  [place], [place]  [address]  Phone: [phone]   Fax: [phone]    DIET: Drink plenty of non-alcoholic fluids. Resume your normal diet. Include foods high in fiber.  ACTIVITY:  You may use crutches or a walker with weight-bearing as tolerated, unless instructed otherwise. You may be weaned off of the walker or crutches by your Physical Therapist.  Do NOT place pillows under the knee. Anything placed under the knee could limit your ability to straighten the knee.   Continue doing gentle exercises. Exercising will reduce the pain and swelling, increase motion, and prevent muscle weakness.   Please continue to use the TED compression stockings for 6 weeks. You may remove the stockings at night, but should reapply them in the morning. Do not drive or operate any equipment until instructed.  WOUND CARE:  Continue to use the PolarCare or ice packs periodically to reduce pain and swelling. You may bathe or shower after the staples are removed at the first office visit following surgery.  MEDICATIONS: You may resume your regular medications. Please take the pain medication as prescribed on the medication. Do not take pain medication on an empty stomach. You have been given a prescription for a blood thinner (Lovenox or Coumadin). Please take the medication as instructed. (NOTE: After completing a 2 week course of Lovenox, take one Enteric-coated aspirin once a day. This along with elevation will help reduce the possibility of phlebitis in your operated leg.) Do not drive or drink alcoholic beverages when taking pain medications.  CALL THE OFFICE FOR: Temperature above 101 degrees Excessive bleeding or drainage on the dressing. Excessive swelling, coldness, or paleness of the toes. Persistent nausea and vomiting.  FOLLOW-UP:  You  should have an appointment to return to the office in 10-14 days after surgery. Arrangements have been made for continuation of Physical Therapy (either home therapy or outpatient therapy).   [facility] Department Directory         [email or web address]       [email or web address]          Cardiology  Appointments: [place] - [phone] [place] - [phone]  Endocrinology  Appointments: [place] - [phone] [place] - [phone]  Gastroenterology  Appointments: [place] - [phone] [place] - [phone]        General Surgery   Appointments: [place] - [phone]  Internal Medicine/Family Medicine  Appointments: [place] - [phone] [place] - [phone] [place] - [phone]  Metabolic and Weigh Loss Surgery  Appointments: [place] - [phone]        Neurology  Appointments: [place] - [phone] [place] - [phone]  Neurosurgery  Appointments: [place] - [phone]  Obstetrics & Gynecology  Appointments: [place] - [phone] [place] - [phone]        Pediatrics  Appointments: [place] - [phone] [place] - [phone]  Physiatry  Appointments: [place] -[phone]  Physical Therapy  Appointments: [place] - [phone] [place] - [phone]        Podiatry  Appointments: [place] - [phone] [place] - [phone]  Pulmonology  Appointments: [place] - [phone]  Rheumatology  Appointments: [place] - [phone]        [place] Location: [facility]  [address] , [place]  [address]  [place] Location: [facility] [address] [place], [place]  [address]  [place] Location: [facility] [address] [place], [place]  [address]    

## 2020-07-25 NOTE — Progress Notes (Signed)
  [facility] Perioperative Services: Pre-Admission/Anesthesia Testing  Abnormal Lab Notification    Date: 07/25/20  Name: Julia Cooper MRN:   [id number]  Re: Abnormal labs noted during PAT appointment   Provider(s) Notified: Dereck Leep, MD and Tamala Julian, PA-C Notification mode: Routed and/or faxed via [facility]   ABNORMAL LAB VALUE(S): Lab Results  Component Value Date   NA 130 (L) 07/25/2020   CL 92 (L) 07/25/2020   GLUCOSE 208 (H) 07/25/2020   Notes:   Patient is on a thiazide diuretic for her blood pressure. Na levels have been chronically low dating back to 2018.    Patient with a T2DM diagnosis. She is currently taking metformin, humulin R insluin, and Ozempic. Last Hgb A1c was 8.3% on 05/23/2020. This communication is being sent in order to determine if patient is deemed to have adequate preoperative glycemic control in efforts to reduce her risk of developing SSI/PJI. The odds ratio for SSI/PJI infection is between 2.8 and 3.4 for orthopedic surgery patients with preoperative serum glucose levels of > 125 mg/dL or a postoperative levels of > 200 mg/dL ([place], 2019).  Data suggests that a Hgb A1c threshold of 7.7% tends to be more indicative of infection than the commonly used 7% and should perhaps be the preoperative patient optimization goal [name] et el., 2017). With that being said, the benefit of improving glycemic control must be weighed against the risks associated with delaying a necessary elective orthopedic surgery for this patient. This is a Community education officer; no formal response is required.  Citations: Charlett Blake, A.F. Reducing the risk of infection after total joint arthroplasty: preoperative optimization. Arthroplasty 1, 4 (2019). [email or web address]  Lorrin Goodell MM, [place], Brigati D, [place] SM, [address] [place] JC, [place], [place], [place], Parvizi Lenna Sciara  [place]. Determining the Threshold for HbA1c as a Predictor for Adverse Outcomes After Total Joint Arthroplasty: A Multicenter, Retrospective Study. J Arthroplasty. 2017 Sep;32(9S):S263-S267.e1. [email or web address].   [name], MSN, APRN, FNP-C, CEN [facility]  Peri-operative Services Nurse Practitioner Phone: [phone] 07/25/20 3:33 PM

## 2020-07-26 ENCOUNTER — Inpatient Hospital Stay: Admission: RE | Admit: 2020-07-26 | Payer: Medicare Other | Source: Ambulatory Visit

## 2020-07-26 ENCOUNTER — Encounter: Payer: Self-pay | Admitting: Urgent Care

## 2020-07-26 ENCOUNTER — Encounter: Payer: Self-pay | Admitting: Orthopedic Surgery

## 2020-07-26 ENCOUNTER — Ambulatory Visit
Admission: RE | Admit: 2020-07-26 | Discharge: 2020-07-26 | Disposition: A | Discharge status: Home / Self Care | Payer: Medicare Other | Source: Ambulatory Visit | Attending: Orthopedic Surgery | Admitting: Orthopedic Surgery

## 2020-07-26 LAB — PROTIME-INR
INR: 0.9 (ref 0.8–1.2)
Prothrombin Time: 12.1 seconds (ref 11.4–15.2)

## 2020-07-26 LAB — CBC
HCT: 39.1 % (ref 36.0–46.0)
Hemoglobin: 13.4 g/dL (ref 12.0–15.0)
MCH: 29.8 pg (ref 26.0–34.0)
MCHC: 34.3 g/dL (ref 30.0–36.0)
MCV: 87.1 fL (ref 80.0–100.0)
Platelets: 284 10*3/uL (ref 150–400)
RBC: 4.49 MIL/uL (ref 3.87–5.11)
RDW: 13.3 % (ref 11.5–15.5)
WBC: 6.6 10*3/uL (ref 4.0–10.5)
nRBC: 0 % (ref 0.0–0.2)

## 2020-07-26 LAB — SURGICAL PCR SCREEN
MRSA, PCR: NEGATIVE
Staphylococcus aureus: NEGATIVE

## 2020-07-26 LAB — SEDIMENTATION RATE: Sed Rate: 15 mm/hr (ref 0–30)

## 2020-07-26 LAB — URINALYSIS, ROUTINE W REFLEX MICROSCOPIC
Bacteria, UA: NONE SEEN
Bilirubin Urine: NEGATIVE
Glucose, UA: 50 mg/dL — AB
Hgb urine dipstick: NEGATIVE
Ketones, ur: NEGATIVE mg/dL
Nitrite: NEGATIVE
Protein, ur: NEGATIVE mg/dL
Specific Gravity, Urine: 1.012 (ref 1.005–1.030)
pH: 7 (ref 5.0–8.0)

## 2020-07-26 LAB — HEPATIC FUNCTION PANEL
ALT: 26 U/L (ref 0–44)
AST: 23 U/L (ref 15–41)
Albumin: 4.4 g/dL (ref 3.5–5.0)
Alkaline Phosphatase: 72 U/L (ref 38–126)
Bilirubin, Direct: 0.2 mg/dL (ref 0.0–0.2)
Indirect Bilirubin: 1.2 mg/dL — ABNORMAL HIGH (ref 0.3–0.9)
Total Bilirubin: 1.4 mg/dL — ABNORMAL HIGH (ref 0.3–1.2)
Total Protein: 7.5 g/dL (ref 6.5–8.1)

## 2020-07-26 LAB — SARS CORONAVIRUS 2 (TAT 6-24 HRS): SARS Coronavirus 2: NEGATIVE

## 2020-07-26 LAB — APTT: aPTT: 30 seconds (ref 24–36)

## 2020-07-26 LAB — C-REACTIVE PROTEIN: CRP: 0.7 mg/dL (ref <1.0)

## 2020-07-26 NOTE — H&P (Signed)
ORTHOPAEDIC HISTORY & PHYSICAL Progress Notes Regino Bellow, PA - 07/25/2020 3:45 PM EDT Chief Complaint No chief complaint on file.  Reason for Visit Julia Cooper is a 69 y.o. who present today for a history and physical. She is to undergo a left total knee arthroplasty on 07/27/2020. Last seen in the clinic on 05/20/2020. She was to undergo a total knee arthroplasty on 05/30/2020 but due to her A1c being significantly elevated this was postponed. Patient had previously undergone a left knee arthroscopy in June 2020. Since that time her pain is increased. She has localized mostly pain along the medial aspect of the knee. Her condition is noted to be worse with weightbearing activities. She is also been reporting some associated swelling as well as no giving way of the knee. Patient states that her pain is increased to the point that significantly interfering with the quality of her life.  Past Medical History Past Medical History:  Diagnosis Date  . Cervical spinal stenosis  s/p surgical repair  . Depression with anxiety  . Diabetes mellitus type 2, uncomplicated (CMS-HCC)  dx'd ~2002  . Hypertension  . Obesity   Past Surgical History Past Surgical History:  Procedure Laterality Date  . BREAST EXCISIONAL BIOPSY Left 07/10/2016  x3--BENIGN  . BREAST LUMPECTOMY WITH NEEDLE LOCALIZATION Left 07/20/2016  Dr Rochel Brome, benign  . Cervical spine surgery, 2002  . CHOLECYSTECTOMY  . COLONOSCOPY 04/24/11  normal per pt.  . excisional breast bx Left 10/22/2018  Dr Lesli Albee  . knee surgery  . Left breast biopsy in February 1993, benign  . Left knee arthroscopy, partial medial meniscectomy, and chondroplasty 06/10/2019  Dr Marry Guan  . toe nail removal Left  08/16/15   Past Family History Family History  Problem Relation Age of Onset  . Stomach cancer Mother  . High blood pressure (Hypertension) Mother  . Diabetes type II Father  . High blood pressure (Hypertension)  Father  . High blood pressure (Hypertension) Sister  . High blood pressure (Hypertension) Maternal Grandmother  . High blood pressure (Hypertension) Maternal Grandfather  . Stroke Maternal Grandfather  . Diabetes Paternal Grandmother  . High blood pressure (Hypertension) Paternal Grandfather  . Anuerysm Paternal Grandfather  . High blood pressure (Hypertension) Son  . No Known Problems Son   Medications Current Outpatient Medications Ordered in Epic  Medication Sig Dispense Refill  . acetaminophen (TYLENOL) 500 MG tablet Take 1,000 mg by mouth every 8 (eight) hours as needed  . cholecalciferol (VITAMIN D3) 2,000 unit tablet Take 2,000 Units by mouth once daily.  Marland Kitchen escitalopram oxalate (LEXAPRO) 10 MG tablet TAKE 1 TABLET BY MOUTH EVERY DAY 90 tablet 1  . hydrALAZINE (APRESOLINE) 50 MG tablet TAKE 1 TABLET(50 MG) BY MOUTH TWICE DAILY 180 tablet 1  . insulin NPH (HUMULIN N NPH INSULIN KWIKPEN) pen injector (concentration 100 units/mL) Inject 36 Units subcutaneously once daily (Patient taking differently: Inject 40 Units subcutaneously nightly ) 30 mL 0  . losartan-hydrochlorothiazide (HYZAAR) 100-25 mg tablet TAKE 1 TABLET BY MOUTH EVERY DAY 90 tablet 1  . magnesium oxide (MAG-OX) 400 mg tablet Take 400 mg by mouth once daily.  . meloxicam (MOBIC) 15 MG tablet TAKE 1 TABLET(15 MG) BY MOUTH EVERY DAY 90 tablet 1  . metFORMIN (FORTAMET) 1000 MG (OSM) 24 hr tablet TAKE 1 TABLET(1000 MG) BY MOUTH TWICE DAILY WITH MEALS 180 tablet 1  . multivitamin tablet Take 1 tablet by mouth once daily.  . pen needle, diabetic (BD ULTRA-FINE NANO  PEN NEEDLE) 32 gauge x 5/32" Ndle Use twice daily as directed. 100 each 6  . potassium chloride (KLOR-CON) 20 MEQ ER tablet TAKE 1 TABLET BY MOUTH EVERY DAY 90 tablet 1  . semaglutide (OZEMPIC) 1 mg/dose (4 mg/3 mL) PnIj Inject 1 mg subcutaneously every 7 (seven) days 3 mL 12   No current Epic-ordered facility-administered medications on file.    Allergies Allergies  Allergen Reactions  . Ace Inhibitors Cough and Other (See Comments)  Headache  . Amlodipine Headache  . Ciprofloxacin Cough and Other (See Comments)  Yeast infection Not sure. May have caused yeast infection  . Doxazosin Cough    Review of Systems A comprehensive 14 point ROS was performed, reviewed, and the pertinent orthopaedic findings are documented in the HPI.  Exam BP (!) 160/93  Pulse 79  Ht 166.4 cm (5' 5.5")  Wt (!) 102.5 kg (226 lb)  LMP (LMP Unknown)  BMI 37.04 kg/m   General: Well-developed well-nourished female seen in no acute distress.   HEENT: Atraumatic,normocephalic. Pupils are equal and reactive to light. Oropharynx is clear with moist mucosa  Lungs: Clear to auscultation bilaterally   Cardiovascular: Regular rate and rhythm. Normal S1, S2. No murmurs. No appreciable gallops or rubs. Peripheral pulses are palpable.  Abdomen: Soft, non-tender, nondistended. Bowel sounds present  Extremity: LeftKnee: Soft tissue swelling:mild Effusion:none Erythema:none Crepitance:mild Tenderness:medial Alignment:relative varus Mediolateral laxity:medial pseudolaxity Posterior HER:DEYCXKGY Patellar tracking:Good tracking without evidence of subluxation or tilt Atrophy:No significantatrophy.  Quadriceps tone was fair to good. Range of motion:0/0/110degrees   Neurological:  The patient is alert and oriented Sensation to light touch appears to be intact and within normal limits Gross motor strength appeared to be equal to 5/5  Vascular :  Peripheral pulses felt to be palpable. Capillary refill appears to be intact and within normal limits No swelling or edema to the lower extremities  X-ray  1. X-rays taken in [place] [facility] of  the left knee showed complete loss of the medial compartment space with bone-on-bone being noted. She was noted to have subchondral sclerosis as well as some osteophyte formation. There is some degenerative changes to the patellofemoral articulation but appears to be tracking well.  Impression  1. Degenerative arthrosis left knee  Plan   1. Patient is to discontinue her meloxicam 15 mg. She states that she has not taken any since Friday. 2. Follow-up 2 weeks postop. Sooner for any problems 3. Surgical procedure was once again discussed with the patient  This note was generated in part with voice recognition software and I apologize for any typographical errors that were not detected and corrected   Watt Climes PA  Electronically signed by Regino Bellow, PA at 07/25/2020 4:42 PM EDT

## 2020-07-27 ENCOUNTER — Inpatient Hospital Stay: Payer: Medicare Other

## 2020-07-27 ENCOUNTER — Encounter: Payer: Self-pay | Admitting: Orthopedic Surgery

## 2020-07-27 ENCOUNTER — Encounter
Admission: RE | Disposition: A | Discharge status: Home Health | Payer: Self-pay | Source: Home / Self Care | Attending: Orthopedic Surgery

## 2020-07-27 ENCOUNTER — Other Ambulatory Visit: Payer: Self-pay

## 2020-07-27 ENCOUNTER — Inpatient Hospital Stay: Payer: Medicare Other | Admitting: Urgent Care

## 2020-07-27 ENCOUNTER — Inpatient Hospital Stay
Admission: RE | Admit: 2020-07-27 | Discharge: 2020-07-29 | DRG: 470 | Disposition: A | Discharge status: Home Health | Payer: Medicare Other | Attending: Orthopedic Surgery | Admitting: Orthopedic Surgery

## 2020-07-27 DIAGNOSIS — E1142 Type 2 diabetes mellitus with diabetic polyneuropathy: Secondary | ICD-10-CM | POA: Diagnosis present

## 2020-07-27 DIAGNOSIS — Z794 Long term (current) use of insulin: Secondary | ICD-10-CM

## 2020-07-27 DIAGNOSIS — Z85828 Personal history of other malignant neoplasm of skin: Secondary | ICD-10-CM

## 2020-07-27 DIAGNOSIS — Z8249 Family history of ischemic heart disease and other diseases of the circulatory system: Secondary | ICD-10-CM

## 2020-07-27 DIAGNOSIS — Z833 Family history of diabetes mellitus: Secondary | ICD-10-CM | POA: Diagnosis not present

## 2020-07-27 DIAGNOSIS — Z79899 Other long term (current) drug therapy: Secondary | ICD-10-CM | POA: Diagnosis not present

## 2020-07-27 DIAGNOSIS — M1712 Unilateral primary osteoarthritis, left knee: Principal | ICD-10-CM | POA: Diagnosis present

## 2020-07-27 DIAGNOSIS — F329 Major depressive disorder, single episode, unspecified: Secondary | ICD-10-CM | POA: Diagnosis present

## 2020-07-27 DIAGNOSIS — E1169 Type 2 diabetes mellitus with other specified complication: Secondary | ICD-10-CM | POA: Diagnosis present

## 2020-07-27 DIAGNOSIS — Z881 Allergy status to other antibiotic agents status: Secondary | ICD-10-CM

## 2020-07-27 DIAGNOSIS — Z888 Allergy status to other drugs, medicaments and biological substances status: Secondary | ICD-10-CM | POA: Diagnosis not present

## 2020-07-27 DIAGNOSIS — Z6837 Body mass index (BMI) 37.0-37.9, adult: Secondary | ICD-10-CM | POA: Diagnosis not present

## 2020-07-27 DIAGNOSIS — Z20822 Contact with and (suspected) exposure to covid-19: Secondary | ICD-10-CM | POA: Diagnosis present

## 2020-07-27 DIAGNOSIS — E1159 Type 2 diabetes mellitus with other circulatory complications: Secondary | ICD-10-CM | POA: Diagnosis present

## 2020-07-27 DIAGNOSIS — M25762 Osteophyte, left knee: Secondary | ICD-10-CM | POA: Diagnosis present

## 2020-07-27 DIAGNOSIS — I10 Essential (primary) hypertension: Secondary | ICD-10-CM | POA: Diagnosis present

## 2020-07-27 DIAGNOSIS — Z96659 Presence of unspecified artificial knee joint: Secondary | ICD-10-CM

## 2020-07-27 DIAGNOSIS — Z9049 Acquired absence of other specified parts of digestive tract: Secondary | ICD-10-CM

## 2020-07-27 DIAGNOSIS — F419 Anxiety disorder, unspecified: Secondary | ICD-10-CM | POA: Diagnosis present

## 2020-07-27 DIAGNOSIS — E6609 Other obesity due to excess calories: Secondary | ICD-10-CM | POA: Diagnosis present

## 2020-07-27 DIAGNOSIS — Z884 Allergy status to anesthetic agent status: Secondary | ICD-10-CM

## 2020-07-27 DIAGNOSIS — K219 Gastro-esophageal reflux disease without esophagitis: Secondary | ICD-10-CM | POA: Diagnosis present

## 2020-07-27 HISTORY — PX: KNEE ARTHROPLASTY: SHX992

## 2020-07-27 LAB — URINE CULTURE: Culture: <10000 — AB

## 2020-07-27 LAB — GLUCOSE, CAPILLARY
Glucose-Capillary: 255 mg/dL — ABNORMAL HIGH (ref 70–99)
Glucose-Capillary: 234 mg/dL — ABNORMAL HIGH (ref 70–99)
Glucose-Capillary: 197 mg/dL — ABNORMAL HIGH (ref 70–99)

## 2020-07-27 LAB — POCT I-STAT, CHEM 8
BUN: 16 mg/dL (ref 8–23)
Calcium, Ion: 1.1 mmol/L — ABNORMAL LOW (ref 1.15–1.40)
Chloride: 92 mmol/L — ABNORMAL LOW (ref 98–111)
Creatinine, Ser: 0.7 mg/dL (ref 0.44–1.00)
Glucose, Bld: 216 mg/dL — ABNORMAL HIGH (ref 70–99)
HCT: 37 % (ref 36.0–46.0)
Hemoglobin: 12.6 g/dL (ref 12.0–15.0)
Potassium: 3.3 mmol/L — ABNORMAL LOW (ref 3.5–5.1)
Sodium: 132 mmol/L — ABNORMAL LOW (ref 135–145)
TCO2: 26 mmol/L (ref 22–32)

## 2020-07-27 SURGERY — ARTHROPLASTY, KNEE, TOTAL, USING IMAGELESS COMPUTER-ASSISTED NAVIGATION
Anesthesia: Spinal | Site: Knee | Laterality: Left

## 2020-07-27 MED ORDER — CEFAZOLIN SODIUM-DEXTROSE 2-4 GM/100ML-% IV SOLN
INTRAVENOUS | Status: AC
  Filled 2020-07-27: qty 100

## 2020-07-27 MED ORDER — MAGNESIUM OXIDE 400 (241.3 MG) MG PO TABS
400.0000 mg | ORAL_TABLET | Freq: Every day | ORAL | Status: DC
  Administered 2020-07-28: 400 mg via ORAL
  Filled 2020-07-27: qty 1

## 2020-07-27 MED ORDER — ONDANSETRON HCL 4 MG/2ML IJ SOLN: 4.0000 mg | Freq: Four times a day (QID) | INTRAMUSCULAR | Status: DC | PRN

## 2020-07-27 MED ORDER — MENTHOL 3 MG MT LOZG
1.0000 | LOZENGE | OROMUCOSAL | Status: DC | PRN
  Filled 2020-07-27: qty 9

## 2020-07-27 MED ORDER — PROPOFOL 500 MG/50ML IV EMUL
INTRAVENOUS | Status: AC
  Filled 2020-07-27: qty 50

## 2020-07-27 MED ORDER — GABAPENTIN 300 MG PO CAPS: 300.0000 mg | ORAL_CAPSULE | Freq: Once | ORAL | Status: AC

## 2020-07-27 MED ORDER — ACETAMINOPHEN 10 MG/ML IV SOLN
INTRAVENOUS | Status: AC
  Filled 2020-07-27: qty 100

## 2020-07-27 MED ORDER — CALCIUM CARBONATE ANTACID 500 MG PO CHEW: 1000.0000 mg | CHEWABLE_TABLET | Freq: Every day | ORAL | Status: DC | PRN

## 2020-07-27 MED ORDER — TRANEXAMIC ACID-NACL 1000-0.7 MG/100ML-% IV SOLN
1000.0000 mg | INTRAVENOUS | Status: AC
  Administered 2020-07-27: 1000 mg via INTRAVENOUS

## 2020-07-27 MED ORDER — ACETAMINOPHEN 325 MG PO TABS: 325.0000 mg | ORAL_TABLET | Freq: Four times a day (QID) | ORAL | Status: DC | PRN

## 2020-07-27 MED ORDER — ONDANSETRON HCL 4 MG/2ML IJ SOLN: 4.0000 mg | Freq: Once | INTRAMUSCULAR | Status: DC | PRN

## 2020-07-27 MED ORDER — SCOPOLAMINE 1 MG/3DAYS TD PT72
1.0000 | MEDICATED_PATCH | TRANSDERMAL | Status: DC
  Administered 2020-07-27: 1.5 mg via TRANSDERMAL

## 2020-07-27 MED ORDER — ACETAMINOPHEN 10 MG/ML IV SOLN
1000.0000 mg | Freq: Four times a day (QID) | INTRAVENOUS | Status: AC
  Administered 2020-07-27 – 2020-07-28 (×4): 1000 mg via INTRAVENOUS
  Filled 2020-07-27 (×4): qty 100

## 2020-07-27 MED ORDER — METOCLOPRAMIDE HCL 10 MG PO TABS
10.0000 mg | ORAL_TABLET | Freq: Three times a day (TID) | ORAL | Status: DC
  Administered 2020-07-27 – 2020-07-29 (×7): 10 mg via ORAL
  Filled 2020-07-27 (×7): qty 1

## 2020-07-27 MED ORDER — CHLORHEXIDINE GLUCONATE 0.12 % MT SOLN: 15.0000 mL | Freq: Once | OROMUCOSAL | Status: AC

## 2020-07-27 MED ORDER — SODIUM CHLORIDE 0.9 % IV SOLN
INTRAVENOUS | Status: DC | PRN
  Administered 2020-07-27: 60 mL

## 2020-07-27 MED ORDER — INSULIN DETEMIR 100 UNIT/ML ~~LOC~~ SOLN
40.0000 [IU] | Freq: Every day | SUBCUTANEOUS | Status: DC
  Administered 2020-07-27 – 2020-07-29 (×2): 40 [IU] via SUBCUTANEOUS
  Filled 2020-07-27 (×3): qty 0.4

## 2020-07-27 MED ORDER — SEMAGLUTIDE (1 MG/DOSE) 4 MG/3ML ~~LOC~~ SOPN
1.0000 mg | PEN_INJECTOR | SUBCUTANEOUS | Status: DC
  Filled 2020-07-27: qty 0.75

## 2020-07-27 MED ORDER — DEXAMETHASONE SODIUM PHOSPHATE 10 MG/ML IJ SOLN
INTRAMUSCULAR | Status: AC
  Administered 2020-07-27: 8 mg via INTRAVENOUS
  Filled 2020-07-27: qty 1

## 2020-07-27 MED ORDER — DEXAMETHASONE SODIUM PHOSPHATE 10 MG/ML IJ SOLN: 8.0000 mg | Freq: Once | INTRAMUSCULAR | Status: AC

## 2020-07-27 MED ORDER — LOSARTAN POTASSIUM 50 MG PO TABS
100.0000 mg | ORAL_TABLET | Freq: Every day | ORAL | Status: DC
  Administered 2020-07-27 – 2020-07-29 (×3): 100 mg via ORAL
  Filled 2020-07-27 (×4): qty 2

## 2020-07-27 MED ORDER — CEFAZOLIN SODIUM-DEXTROSE 2-4 GM/100ML-% IV SOLN
2.0000 g | Freq: Four times a day (QID) | INTRAVENOUS | Status: AC
  Administered 2020-07-27 – 2020-07-28 (×4): 2 g via INTRAVENOUS
  Filled 2020-07-27 (×4): qty 100

## 2020-07-27 MED ORDER — MAGNESIUM HYDROXIDE 400 MG/5ML PO SUSP
30.0000 mL | Freq: Every day | ORAL | Status: DC
  Administered 2020-07-28 – 2020-07-29 (×2): 30 mL via ORAL
  Filled 2020-07-27 (×2): qty 30

## 2020-07-27 MED ORDER — OXYCODONE HCL 5 MG PO TABS
10.0000 mg | ORAL_TABLET | ORAL | Status: DC | PRN
  Administered 2020-07-27 – 2020-07-28 (×3): 10 mg via ORAL
  Filled 2020-07-27 (×3): qty 2

## 2020-07-27 MED ORDER — CELECOXIB 200 MG PO CAPS: 400.0000 mg | ORAL_CAPSULE | Freq: Once | ORAL | Status: AC

## 2020-07-27 MED ORDER — FLEET ENEMA 7-19 GM/118ML RE ENEM: 1.0000 | ENEMA | Freq: Once | RECTAL | Status: DC | PRN

## 2020-07-27 MED ORDER — PANTOPRAZOLE SODIUM 40 MG PO TBEC
40.0000 mg | DELAYED_RELEASE_TABLET | Freq: Two times a day (BID) | ORAL | Status: DC
  Administered 2020-07-28 – 2020-07-29 (×3): 40 mg via ORAL
  Filled 2020-07-27 (×3): qty 1

## 2020-07-27 MED ORDER — SODIUM CHLORIDE 0.9 % IV SOLN: INTRAVENOUS | Status: DC

## 2020-07-27 MED ORDER — MIDAZOLAM HCL 2 MG/2ML IJ SOLN
INTRAMUSCULAR | Status: AC
  Filled 2020-07-27: qty 2

## 2020-07-27 MED ORDER — FAMOTIDINE 20 MG PO TABS: 20.0000 mg | ORAL_TABLET | Freq: Once | ORAL | Status: AC

## 2020-07-27 MED ORDER — ESCITALOPRAM OXALATE 10 MG PO TABS
10.0000 mg | ORAL_TABLET | Freq: Every day | ORAL | Status: DC
  Administered 2020-07-27 – 2020-07-28 (×2): 10 mg via ORAL
  Filled 2020-07-27 (×3): qty 1

## 2020-07-27 MED ORDER — HYDRALAZINE HCL 50 MG PO TABS
50.0000 mg | ORAL_TABLET | Freq: Two times a day (BID) | ORAL | Status: DC
  Administered 2020-07-27 – 2020-07-29 (×4): 50 mg via ORAL
  Filled 2020-07-27 (×4): qty 1

## 2020-07-27 MED ORDER — TRANEXAMIC ACID-NACL 1000-0.7 MG/100ML-% IV SOLN
INTRAVENOUS | Status: AC
  Filled 2020-07-27: qty 100

## 2020-07-27 MED ORDER — CEFAZOLIN SODIUM-DEXTROSE 2-4 GM/100ML-% IV SOLN
2.0000 g | INTRAVENOUS | Status: AC
  Administered 2020-07-27: 2 g via INTRAVENOUS

## 2020-07-27 MED ORDER — LIDOCAINE HCL (CARDIAC) PF 100 MG/5ML IV SOSY
PREFILLED_SYRINGE | INTRAVENOUS | Status: DC | PRN
  Administered 2020-07-27: 50 mg via INTRAVENOUS

## 2020-07-27 MED ORDER — HYDROCHLOROTHIAZIDE 25 MG PO TABS
25.0000 mg | ORAL_TABLET | Freq: Every day | ORAL | Status: DC
  Administered 2020-07-27 – 2020-07-29 (×3): 25 mg via ORAL
  Filled 2020-07-27 (×3): qty 1

## 2020-07-27 MED ORDER — METFORMIN HCL ER 500 MG PO TB24
1000.0000 mg | ORAL_TABLET | Freq: Two times a day (BID) | ORAL | Status: DC
  Administered 2020-07-27 – 2020-07-29 (×4): 1000 mg via ORAL
  Filled 2020-07-27 (×6): qty 2

## 2020-07-27 MED ORDER — ENSURE ENLIVE PO LIQD: 296.0000 mL | Freq: Once | ORAL | Status: DC

## 2020-07-27 MED ORDER — TRANEXAMIC ACID-NACL 1000-0.7 MG/100ML-% IV SOLN
1000.0000 mg | Freq: Once | INTRAVENOUS | Status: AC
  Administered 2020-07-27: 1000 mg via INTRAVENOUS

## 2020-07-27 MED ORDER — PROPOFOL 10 MG/ML IV BOLUS
INTRAVENOUS | Status: AC
  Filled 2020-07-27: qty 20

## 2020-07-27 MED ORDER — BUPIVACAINE HCL (PF) 0.25 % IJ SOLN
INTRAMUSCULAR | Status: AC
  Filled 2020-07-27: qty 30

## 2020-07-27 MED ORDER — NEOMYCIN-POLYMYXIN B GU 40-200000 IR SOLN
Status: DC | PRN
  Administered 2020-07-27: 2 mL
  Administered 2020-07-27: 12 mL

## 2020-07-27 MED ORDER — CELECOXIB 200 MG PO CAPS
200.0000 mg | ORAL_CAPSULE | Freq: Two times a day (BID) | ORAL | Status: DC
  Administered 2020-07-27 – 2020-07-29 (×4): 200 mg via ORAL
  Filled 2020-07-27 (×4): qty 1

## 2020-07-27 MED ORDER — ONDANSETRON HCL 4 MG PO TABS: 4.0000 mg | ORAL_TABLET | Freq: Four times a day (QID) | ORAL | Status: DC | PRN

## 2020-07-27 MED ORDER — GABAPENTIN 300 MG PO CAPS
300.0000 mg | ORAL_CAPSULE | Freq: Every day | ORAL | Status: DC
  Administered 2020-07-27 – 2020-07-28 (×2): 300 mg via ORAL
  Filled 2020-07-27 (×2): qty 1

## 2020-07-27 MED ORDER — LOSARTAN POTASSIUM-HCTZ 100-25 MG PO TABS: 1.0000 | ORAL_TABLET | Freq: Every morning | ORAL | Status: DC

## 2020-07-27 MED ORDER — PROPOFOL 10 MG/ML IV BOLUS
INTRAVENOUS | Status: DC | PRN
  Administered 2020-07-27: 20 mg via INTRAVENOUS
  Administered 2020-07-27: 30 mg via INTRAVENOUS

## 2020-07-27 MED ORDER — CHLORHEXIDINE GLUCONATE 4 % EX LIQD: 60.0000 mL | Freq: Once | CUTANEOUS | Status: DC

## 2020-07-27 MED ORDER — VITAMIN D 25 MCG (1000 UNIT) PO TABS
2000.0000 [IU] | ORAL_TABLET | Freq: Every day | ORAL | Status: DC
  Administered 2020-07-28 – 2020-07-29 (×2): 2000 [IU] via ORAL
  Filled 2020-07-27 (×2): qty 2

## 2020-07-27 MED ORDER — BUPIVACAINE HCL (PF) 0.5 % IJ SOLN
INTRAMUSCULAR | Status: AC
  Filled 2020-07-27: qty 30

## 2020-07-27 MED ORDER — BUPIVACAINE HCL (PF) 0.5 % IJ SOLN
INTRAMUSCULAR | Status: DC | PRN
  Administered 2020-07-27: 3 mL

## 2020-07-27 MED ORDER — ALUM & MAG HYDROXIDE-SIMETH 200-200-20 MG/5ML PO SUSP: 30.0000 mL | ORAL | Status: DC | PRN

## 2020-07-27 MED ORDER — BISACODYL 10 MG RE SUPP: 10.0000 mg | Freq: Every day | RECTAL | Status: DC | PRN

## 2020-07-27 MED ORDER — ACETAMINOPHEN 10 MG/ML IV SOLN
INTRAVENOUS | Status: DC | PRN
  Administered 2020-07-27: 1000 mg via INTRAVENOUS

## 2020-07-27 MED ORDER — FERROUS SULFATE 325 (65 FE) MG PO TABS
325.0000 mg | ORAL_TABLET | Freq: Two times a day (BID) | ORAL | Status: DC
  Administered 2020-07-28 – 2020-07-29 (×3): 325 mg via ORAL
  Filled 2020-07-27 (×3): qty 1

## 2020-07-27 MED ORDER — INSULIN ASPART 100 UNIT/ML ~~LOC~~ SOLN
0.0000 [IU] | Freq: Three times a day (TID) | SUBCUTANEOUS | Status: DC
  Administered 2020-07-27: 5 [IU] via SUBCUTANEOUS
  Administered 2020-07-27: 8 [IU] via SUBCUTANEOUS
  Administered 2020-07-28 (×3): 3 [IU] via SUBCUTANEOUS
  Administered 2020-07-29: 5 [IU] via SUBCUTANEOUS
  Administered 2020-07-29: 2 [IU] via SUBCUTANEOUS
  Filled 2020-07-27 (×6): qty 1

## 2020-07-27 MED ORDER — INSULIN ASPART 100 UNIT/ML ~~LOC~~ SOLN
SUBCUTANEOUS | Status: AC
  Filled 2020-07-27: qty 1

## 2020-07-27 MED ORDER — GABAPENTIN 300 MG PO CAPS
ORAL_CAPSULE | ORAL | Status: AC
  Administered 2020-07-27: 300 mg via ORAL
  Filled 2020-07-27: qty 1

## 2020-07-27 MED ORDER — PROPOFOL 500 MG/50ML IV EMUL
INTRAVENOUS | Status: DC | PRN
  Administered 2020-07-27: 100 ug/kg/min via INTRAVENOUS

## 2020-07-27 MED ORDER — CELECOXIB 200 MG PO CAPS
ORAL_CAPSULE | ORAL | Status: AC
  Administered 2020-07-27: 400 mg via ORAL
  Filled 2020-07-27: qty 2

## 2020-07-27 MED ORDER — ORAL CARE MOUTH RINSE: 15.0000 mL | Freq: Once | OROMUCOSAL | Status: AC

## 2020-07-27 MED ORDER — SENNOSIDES-DOCUSATE SODIUM 8.6-50 MG PO TABS
1.0000 | ORAL_TABLET | Freq: Two times a day (BID) | ORAL | Status: DC
  Administered 2020-07-27 – 2020-07-29 (×4): 1 via ORAL
  Filled 2020-07-27 (×4): qty 1

## 2020-07-27 MED ORDER — PHENOL 1.4 % MT LIQD
1.0000 | OROMUCOSAL | Status: DC | PRN
  Filled 2020-07-27: qty 177

## 2020-07-27 MED ORDER — MIDAZOLAM HCL 5 MG/5ML IJ SOLN
INTRAMUSCULAR | Status: DC | PRN
  Administered 2020-07-27: 2 mg via INTRAVENOUS

## 2020-07-27 MED ORDER — POTASSIUM CHLORIDE CRYS ER 20 MEQ PO TBCR
20.0000 meq | EXTENDED_RELEASE_TABLET | Freq: Every day | ORAL | Status: DC
  Administered 2020-07-27 – 2020-07-28 (×2): 20 meq via ORAL
  Filled 2020-07-27 (×2): qty 1

## 2020-07-27 MED ORDER — FAMOTIDINE 20 MG PO TABS
ORAL_TABLET | ORAL | Status: AC
  Administered 2020-07-27: 20 mg
  Filled 2020-07-27: qty 1

## 2020-07-27 MED ORDER — FENTANYL CITRATE (PF) 100 MCG/2ML IJ SOLN: 25.0000 ug | INTRAMUSCULAR | Status: DC | PRN

## 2020-07-27 MED ORDER — SCOPOLAMINE 1 MG/3DAYS TD PT72
MEDICATED_PATCH | TRANSDERMAL | Status: AC
  Filled 2020-07-27: qty 1

## 2020-07-27 MED ORDER — CHLORHEXIDINE GLUCONATE 0.12 % MT SOLN
OROMUCOSAL | Status: AC
  Administered 2020-07-27: 15 mL
  Filled 2020-07-27: qty 15

## 2020-07-27 MED ORDER — HYDROMORPHONE HCL 1 MG/ML IJ SOLN: 0.5000 mg | INTRAMUSCULAR | Status: DC | PRN

## 2020-07-27 MED ORDER — TRAMADOL HCL 50 MG PO TABS: 50.0000 mg | ORAL_TABLET | ORAL | Status: DC | PRN

## 2020-07-27 MED ORDER — BUPIVACAINE LIPOSOME 1.3 % IJ SUSP
INTRAMUSCULAR | Status: AC
  Filled 2020-07-27: qty 20

## 2020-07-27 MED ORDER — ENOXAPARIN SODIUM 30 MG/0.3ML ~~LOC~~ SOLN
30.0000 mg | Freq: Two times a day (BID) | SUBCUTANEOUS | Status: DC
  Administered 2020-07-28 – 2020-07-29 (×3): 30 mg via SUBCUTANEOUS
  Filled 2020-07-27 (×3): qty 0.3

## 2020-07-27 MED ORDER — ADULT MULTIVITAMIN W/MINERALS CH
1.0000 | ORAL_TABLET | Freq: Every day | ORAL | Status: DC
  Administered 2020-07-28 – 2020-07-29 (×2): 1 via ORAL
  Filled 2020-07-27 (×2): qty 1

## 2020-07-27 MED ORDER — OXYCODONE HCL 5 MG PO TABS
5.0000 mg | ORAL_TABLET | ORAL | Status: DC | PRN
  Administered 2020-07-28 – 2020-07-29 (×3): 5 mg via ORAL
  Filled 2020-07-27 (×3): qty 1

## 2020-07-27 MED ORDER — BUPIVACAINE HCL (PF) 0.25 % IJ SOLN
INTRAMUSCULAR | Status: DC | PRN
  Administered 2020-07-27: 60 mL

## 2020-07-27 MED ORDER — LIDOCAINE HCL (PF) 2 % IJ SOLN
INTRAMUSCULAR | Status: AC
  Filled 2020-07-27: qty 5

## 2020-07-27 MED ORDER — DIPHENHYDRAMINE HCL 12.5 MG/5ML PO ELIX: 12.5000 mg | ORAL_SOLUTION | ORAL | Status: DC | PRN

## 2020-07-27 MED ORDER — NEOMYCIN-POLYMYXIN B GU 40-200000 IR SOLN
Status: AC
  Filled 2020-07-27: qty 20

## 2020-07-27 SURGICAL SUPPLY — 74 items
ATTUNE PSFEM LTSZ6 NARCEM KNEE (Femur) ×2 IMPLANT
ATTUNE PSRP INSR SZ6 7 KNEE (Insert) ×1 IMPLANT
ATTUNE PSRP INSR SZ6 7MM KNEE (Insert) ×1 IMPLANT
BASE TIBIAL ROT PLAT SZ 5 KNEE (Knees) IMPLANT
BATTERY INSTRU NAVIGATION (MISCELLANEOUS) ×12 IMPLANT
BLADE SAW 70X12.5 (BLADE) ×3 IMPLANT
BLADE SAW 90X13X1.19 OSCILLAT (BLADE) ×3 IMPLANT
BLADE SAW 90X25X1.19 OSCILLAT (BLADE) ×3 IMPLANT
BONE CEMENT GENTAMICIN (Cement) ×6 IMPLANT
CANISTER SUCT 3000ML PPV (MISCELLANEOUS) ×3 IMPLANT
CEMENT BONE GENTAMICIN 40 (Cement) IMPLANT
COOLER POLAR GLACIER W/PUMP (MISCELLANEOUS) ×3 IMPLANT
COVER WAND RF STERILE (DRAPES) ×3 IMPLANT
CUFF TOURN SGL QUICK 24 (TOURNIQUET CUFF)
CUFF TOURN SGL QUICK 30 (TOURNIQUET CUFF)
CUFF TRNQT CYL 24X4X16.5-23 (TOURNIQUET CUFF) IMPLANT
CUFF TRNQT CYL 30X4X21-28X (TOURNIQUET CUFF) IMPLANT
DRAPE 3/4 80X56 (DRAPES) ×3 IMPLANT
DRSG DERMACEA 8X12 NADH (GAUZE/BANDAGES/DRESSINGS) ×3 IMPLANT
DRSG OPSITE POSTOP 4X14 (GAUZE/BANDAGES/DRESSINGS) ×3 IMPLANT
DRSG TEGADERM 4X4.75 (GAUZE/BANDAGES/DRESSINGS) ×3 IMPLANT
DURAPREP 26ML APPLICATOR (WOUND CARE) ×6 IMPLANT
ELECT REM PT RETURN 9FT ADLT (ELECTROSURGICAL) ×3
ELECTRODE REM PT RTRN 9FT ADLT (ELECTROSURGICAL) ×1 IMPLANT
EX-PIN ORTHOLOCK NAV 4X150 (PIN) ×6 IMPLANT
GLOVE BIO SURGEON STRL SZ7.5 (GLOVE) ×6 IMPLANT
GLOVE BIOGEL M STRL SZ7.5 (GLOVE) ×6 IMPLANT
GLOVE BIOGEL PI IND STRL 7.5 (GLOVE) ×1 IMPLANT
GLOVE BIOGEL PI INDICATOR 7.5 (GLOVE) ×2
GLOVE INDICATOR 8.0 STRL GRN (GLOVE) ×3 IMPLANT
GOWN STRL REUS W/ TWL LRG LVL3 (GOWN DISPOSABLE) ×2 IMPLANT
GOWN STRL REUS W/ TWL XL LVL3 (GOWN DISPOSABLE) ×1 IMPLANT
GOWN STRL REUS W/TWL LRG LVL3 (GOWN DISPOSABLE) ×4
GOWN STRL REUS W/TWL XL LVL3 (GOWN DISPOSABLE) ×2
HEMOVAC 400CC 10FR (MISCELLANEOUS) ×3 IMPLANT
HOLDER FOLEY CATH W/STRAP (MISCELLANEOUS) ×3 IMPLANT
HOOD PEEL AWAY FLYTE STAYCOOL (MISCELLANEOUS) ×6 IMPLANT
KIT TURNOVER KIT A (KITS) ×3 IMPLANT
KNIFE SCULPS 14X20 (INSTRUMENTS) ×3 IMPLANT
LABEL OR SOLS (LABEL) ×3 IMPLANT
MANIFOLD NEPTUNE II (INSTRUMENTS) ×3 IMPLANT
NDL SAFETY ECLIPSE 18X1.5 (NEEDLE) ×1 IMPLANT
NDL SPNL 20GX3.5 QUINCKE YW (NEEDLE) ×2 IMPLANT
NEEDLE HYPO 18GX1.5 SHARP (NEEDLE) ×2
NEEDLE SPNL 20GX3.5 QUINCKE YW (NEEDLE) ×6 IMPLANT
NS IRRIG 500ML POUR BTL (IV SOLUTION) ×3 IMPLANT
PACK TOTAL KNEE (MISCELLANEOUS) ×3 IMPLANT
PAD WRAPON POLAR KNEE (MISCELLANEOUS) ×1 IMPLANT
PATELLA MEDIAL ATTUN 35MM KNEE (Knees) ×2 IMPLANT
PENCIL SMOKE EVACUATOR COATED (MISCELLANEOUS) ×3 IMPLANT
PENCIL SMOKE ULTRAEVAC 22 CON (MISCELLANEOUS) ×3 IMPLANT
PIN FIXATION 1/8DIA X 3INL (PIN) ×9 IMPLANT
PULSAVAC PLUS IRRIG FAN TIP (DISPOSABLE) ×3
SOL .9 NS 3000ML IRR  AL (IV SOLUTION) ×2
SOL .9 NS 3000ML IRR UROMATIC (IV SOLUTION) ×1 IMPLANT
SOL PREP PVP 2OZ (MISCELLANEOUS) ×3
SOLUTION PREP PVP 2OZ (MISCELLANEOUS) ×1 IMPLANT
SPONGE DRAIN TRACH 4X4 STRL 2S (GAUZE/BANDAGES/DRESSINGS) ×3 IMPLANT
STAPLER SKIN PROX 35W (STAPLE) ×3 IMPLANT
STOCKINETTE IMPERV 14X48 (MISCELLANEOUS) IMPLANT
STRAP TIBIA SHORT (MISCELLANEOUS) ×3 IMPLANT
SUCTION FRAZIER HANDLE 10FR (MISCELLANEOUS) ×2
SUCTION TUBE FRAZIER 10FR DISP (MISCELLANEOUS) ×1 IMPLANT
SUT VIC AB 0 CT1 36 (SUTURE) ×6 IMPLANT
SUT VIC AB 1 CT1 36 (SUTURE) ×6 IMPLANT
SUT VIC AB 2-0 CT2 27 (SUTURE) ×3 IMPLANT
SYR 20ML LL LF (SYRINGE) ×3 IMPLANT
SYR 30ML LL (SYRINGE) ×6 IMPLANT
TIBIAL BASE ROT PLAT SZ 5 KNEE (Knees) ×3 IMPLANT
TIP FAN IRRIG PULSAVAC PLUS (DISPOSABLE) ×1 IMPLANT
TOWEL OR 17X26 4PK STRL BLUE (TOWEL DISPOSABLE) ×3 IMPLANT
TOWER CARTRIDGE SMART MIX (DISPOSABLE) ×3 IMPLANT
TRAY FOLEY MTR SLVR 16FR STAT (SET/KITS/TRAYS/PACK) ×3 IMPLANT
WRAPON POLAR PAD KNEE (MISCELLANEOUS) ×3

## 2020-07-27 NOTE — Op Note (Signed)
OPERATIVE NOTE  DATE OF SURGERY:  07/27/2020  PATIENT NAME:  Julia Cooper   DOB: [birth year]  MRN: [id number]  PRE-OPERATIVE DIAGNOSIS: Degenerative arthrosis of the left knee, primary  POST-OPERATIVE DIAGNOSIS:  Same  PROCEDURE:  Left total knee arthroplasty using computer-assisted navigation  SURGEON:  Marciano Sequin. M.D.  ASSISTANT: Cassell Smiles, PA-C (present and scrubbed throughout the case, critical for assistance with exposure, retraction, instrumentation, and closure)  ANESTHESIA: spinal  ESTIMATED BLOOD LOSS: 50 mL  FLUIDS REPLACED: 1100 mL of crystalloid  TOURNIQUET TIME: 90 minutes  DRAINS: 2 medium Hemovac drains  SOFT TISSUE RELEASES: Anterior cruciate ligament, posterior cruciate ligament, deep medial collateral ligament, patellofemoral ligament  IMPLANTS UTILIZED: DePuy Attune size 6N posterior stabilized femoral component (cemented), size 5 rotating platform tibial component (cemented), 35 mm medialized dome patella (cemented), and a 7 mm stabilized rotating platform polyethylene insert.  INDICATIONS FOR SURGERY: Julia Cooper is a 69 y.o. year old female with a long history of progressive knee pain. X-rays demonstrated severe degenerative changes in tricompartmental fashion. The patient had not seen any significant improvement despite conservative nonsurgical intervention. After discussion of the risks and benefits of surgical intervention, the patient expressed understanding of the risks benefits and agree with plans for total knee arthroplasty.   The risks, benefits, and alternatives were discussed at length including but not limited to the risks of infection, bleeding, nerve injury, stiffness, blood clots, the need for revision surgery, cardiopulmonary complications, among others, and they were willing to proceed.  PROCEDURE IN DETAIL: The patient was brought into the operating room and, after adequate spinal anesthesia was achieved, a tourniquet  was placed on the patient's upper thigh. The patient's knee and leg were cleaned and prepped with alcohol and DuraPrep and draped in the usual sterile fashion. A "timeout" was performed as per usual protocol. The lower extremity was exsanguinated using an Esmarch, and the tourniquet was inflated to 300 mmHg. An anterior longitudinal incision was made followed by a standard mid vastus approach. The deep fibers of the medial collateral ligament were elevated in a subperiosteal fashion off of the medial flare of the tibia so as to maintain a continuous soft tissue sleeve. The patella was subluxed laterally and the patellofemoral ligament was incised. Inspection of the knee demonstrated severe degenerative changes with full-thickness loss of articular cartilage. Osteophytes were debrided using a rongeur. Anterior and posterior cruciate ligaments were excised. Two 4.0 mm Schanz pins were inserted in the femur and into the tibia for attachment of the array of trackers used for computer-assisted navigation. Hip center was identified using a circumduction technique. Distal landmarks were mapped using the computer. The distal femur and proximal tibia were mapped using the computer. The distal femoral cutting guide was positioned using computer-assisted navigation so as to achieve a 5 distal valgus cut. The femur was sized and it was felt that a size 6N femoral component was appropriate. A size 6 femoral cutting guide was positioned and the anterior cut was performed and verified using the computer. This was followed by completion of the posterior and chamfer cuts. Femoral cutting guide for the central box was then positioned in the center box cut was performed.  Attention was then directed to the proximal tibia. Medial and lateral menisci were excised. The extramedullary tibial cutting guide was positioned using computer-assisted navigation so as to achieve a 0 varus-valgus alignment and 3 posterior slope. The cut was  performed and verified using the computer. The proximal tibia was  sized and it was felt that a size 5 tibial tray was appropriate. Tibial and femoral trials were inserted followed by insertion of a 7 mm polyethylene insert. This allowed for excellent mediolateral soft tissue balancing both in flexion and in full extension. Finally, the patella was cut and prepared so as to accommodate a 35 mm medialized dome patella. A patella trial was placed and the knee was placed through a range of motion with excellent patellar tracking appreciated. The femoral trial was removed after debridement of posterior osteophytes. The central post-hole for the tibial component was reamed followed by insertion of a keel punch. Tibial trials were then removed. Cut surfaces of bone were irrigated with copious amounts of normal saline with antibiotic solution using pulsatile lavage and then suctioned dry. Polymethylmethacrylate cement with gentamicin was prepared in the usual fashion using a vacuum mixer. Cement was applied to the cut surface of the proximal tibia as well as along the undersurface of a size 5 rotating platform tibial component. Tibial component was positioned and impacted into place. Excess cement was removed using [employer] fast streamer. Cement was then applied to the cut surfaces of the femur as well as along the posterior flanges of the size 6N femoral component. The femoral component was positioned and impacted into place. Excess cement was removed using [employer] fast streamer. A 7 mm polyethylene trial was inserted and the knee was brought into full extension with steady axial compression applied. Finally, cement was applied to the backside of a 35 mm medialized dome patella and the patellar component was positioned and patellar clamp applied. Excess cement was removed using [employer] fast streamer. After adequate curing of the cement, the tourniquet was deflated after a total tourniquet time of 90 minutes. Hemostasis was achieved using  electrocautery. The knee was irrigated with copious amounts of normal saline with antibiotic solution using pulsatile lavage and then suctioned dry. 20 mL of 1.3% Exparel and 60 mL of 0.25% Marcaine in 40 mL of normal saline was injected along the posterior capsule, medial and lateral gutters, and along the arthrotomy site. A 7 mm stabilized rotating platform polyethylene insert was inserted and the knee was placed through a range of motion with excellent mediolateral soft tissue balancing appreciated and excellent patellar tracking noted. 2 medium drains were placed in the wound bed and brought out through separate stab incisions. The medial parapatellar portion of the incision was reapproximated using interrupted sutures of #1 Vicryl. Subcutaneous tissue was approximated in layers using first #0 Vicryl followed #2-0 Vicryl. The skin was approximated with skin staples. A sterile dressing was applied.  The patient tolerated the procedure well and was transported to the recovery room in stable condition.    Devany Aja P. Holley Bouche., M.D.

## 2020-07-27 NOTE — Anesthesia Preprocedure Evaluation (Signed)
Anesthesia Evaluation  Patient identified by MRN, date of birth, ID band Patient awake    Reviewed: Allergy & Precautions, NPO status , Unable to perform ROS - Chart review only  History of Anesthesia Complications (+) PONV and history of anesthetic complications  Airway Mallampati: III       Dental   Pulmonary neg sleep apnea, neg COPD, Not current smoker,           Cardiovascular hypertension, Pt. on medications (-) Past MI and (-) CHF (-) dysrhythmias (-) Valvular Problems/Murmurs     Neuro/Psych neg Seizures Anxiety Depression    GI/Hepatic Neg liver ROS, GERD  Medicated and Controlled,  Endo/Other  diabetes, Type 2, Oral Hypoglycemic Agents  Renal/GU      Musculoskeletal   Abdominal   Peds  Hematology   Anesthesia Other Findings   Reproductive/Obstetrics                             Anesthesia Physical Anesthesia Plan  ASA: III  Anesthesia Plan:    Post-op Pain Management:    Induction: Intravenous  PONV Risk Score and Plan: 3 and Propofol infusion, TIVA and Ondansetron  Airway Management Planned: Nasal Cannula  Additional Equipment:   Intra-op Plan:   Post-operative Plan:   Informed Consent: I have reviewed the patients History and Physical, chart, labs and discussed the procedure including the risks, benefits and alternatives for the proposed anesthesia with the patient or authorized representative who has indicated his/her understanding and acceptance.       Plan Discussed with:   Anesthesia Plan Comments:         Anesthesia Quick Evaluation

## 2020-07-27 NOTE — H&P (Signed)
The patient has been re-examined, and the chart reviewed, and there have been no interval changes to the documented history and physical.    The risks, benefits, and alternatives have been discussed at length. The patient expressed understanding of the risks benefits and agreed with plans for surgical intervention.  Freeland Pracht P. Jahmani Staup, Jr. M.D.    

## 2020-07-27 NOTE — Evaluation (Signed)
Physical Therapy Evaluation Patient Details Name: Julia Cooper MRN: [id number] DOB: [birth year] Today's Date: 07/27/2020   History of Present Illness  Pt is a 69 yo female diagnosed with degenerative arthrosis of the left knee and is s/p elective L TKA.  PMH includes: breast CA, cervical stenosis s/p surgical repair, depression, anxiety, DM, and HTN.  Clinical Impression  Pt was pleasant and motivated to participate during the session. Pt reported 6/10 pain at beginning of session; remained alert and responded to all questions but somewhat lethargic with eyes closed for a good portion of session. Pt L knee ROM measured today as 3-62deg. Pt able to perform supine/sitting exercises w/ min instructional cueing; given HEP packet w/ instruction on frequency. Pt initially required assist w/ SLR but progressed to ability to perform ~8 SLR w/ no assist and no extensor lag. Pt demonstrated mod-I bed mobility w/ min positional cueing for sit-supine. Pt required no physical assist for sit-to-stand but min-A immediately upon standing to prevent posterior LOB; min cueing for positioning/sequencing. Pt able to ambulate ~25ft up to [facility] w/ small, shuffled, side steps; pt had 3 instances of posterior LOB during this time and required min-A to regain balance. Pt and spouse were given education on positioning to prevent heel pressure and promote L knee PROM. Pt will benefit from PT services in a SNF setting upon discharge to safely address deficits listed in patient problem list for decreased caregiver assistance and eventual return to PLOF.     Follow Up Recommendations SNF    Equipment Recommendations  None recommended by PT    Recommendations for Other Services       Precautions / Restrictions Precautions Precautions: Fall Precaution Comments: able to perform unassisted SLR >5x w/ no extensor lag: does not require KI Restrictions Weight Bearing Restrictions: Yes LLE Weight Bearing: Weight bearing as  tolerated      Mobility  Bed Mobility Overal bed mobility: Needs Assistance Bed Mobility: Supine to Sit;Sit to Supine     Supine to sit: Supervision Sit to supine: Supervision   General bed mobility comments: generally required supervision as well as extra time/effort; min cueing for repositioning w/ sit-to-supine  Transfers Overall transfer level: Needs assistance Equipment used: Rolling walker (2 wheeled) Transfers: Sit to/from Stand Sit to Stand: Min assist         General transfer comment: min cueing for sequencing/positioning; no physical assist to come to standing but required min-A to prevent posterior loss of balance once in standing  Ambulation/Gait Ambulation/Gait assistance: Min assist Gait Distance (Feet): 3 Feet Assistive device: Rolling walker (2 wheeled) Gait Pattern/deviations: Decreased step length - right;Decreased step length - left;Step-to pattern;Shuffle Gait velocity: decreased   General Gait Details: Pt able to side-step / shuffle 14ft up to head of bed. Pt had a posterior LOB 3x during this time and required min-A to regain balance. Min cueing for sequencing  Stairs            Wheelchair Mobility    Modified Rankin (Stroke Patients Only)       Balance Overall balance assessment: Needs assistance Sitting-balance support: Feet unsupported;Feet supported Sitting balance-Leahy Scale: Good     Standing balance support: Bilateral upper extremity supported;During functional activity Standing balance-Leahy Scale: Poor Standing balance comment: Pt had multiple instances of posterior LOB in standing; shuffled side steps w/ minimal ability shifting weight through LLE  Pertinent Vitals/Pain Pain Assessment: 0-10 Pain Score: 6  Pain Location: L knee Pain Descriptors / Indicators: Aching;Sore;Grimacing Pain Intervention(s): Limited activity within patient's tolerance;Monitored during  session;Repositioned;Ice applied    Home Living Family/patient expects to be discharged to:: Private residence Living Arrangements: Spouse/significant other Available Help at Discharge: Family;Available 24 hours/day Type of Home: House Home Access: Stairs to enter   [employer] of Steps: 2 stairs to enter w/ no rail; 4 stairs to enter w/ R rail Home Layout: One level Home Equipment: Walker - 2 wheels;Cane - single point;Shower seat;Bedside commode;Grab bars - tub/shower Additional Comments: Pt has 2WW and BSC borrowed from friend    Prior Function Level of Independence: Independent         Comments: Prior to admission, pt states able to walk community distances w/ difficulty (leans on grocery cart w/ shopping, occasional SPC use) and was limited by knee pain. Pt states occasionally needed help from husband to stand up from a low chair, but otherwise independent. Pt estimates 3x fall in the past year due to LOB / knee buckling     Hand Dominance        Extremity/Trunk Assessment   Upper Extremity Assessment Upper Extremity Assessment: Overall WFL for tasks assessed    Lower Extremity Assessment Lower Extremity Assessment: LLE deficits/detail LLE Deficits / Details: BLE: dorsiflexion ROM/strength >3/5. Pt reports BLE peripheral neuropathy - sensation at baseline bilaterally LLE Sensation: history of peripheral neuropathy (per pt)       Communication      Cognition Arousal/Alertness: Awake/alert Behavior During Therapy: WFL for tasks assessed/performed Overall Cognitive Status: Within Functional Limits for tasks assessed                                 General Comments: A&Ox4; eyes closed for a good amount of session (2/2 pain?) but pt always responsive to questions      General Comments      Exercises Total Joint Exercises Ankle Circles/Pumps: AROM;Strengthening;Both;10 reps Quad Sets: Strengthening;Both;10 reps Gluteal Sets:  Strengthening;Both;10 reps Straight Leg Raises: Strengthening;AROM;Left;10 reps Long Arc Quad: AROM;Strengthening;Left;10 reps Knee Flexion: AROM;Strengthening;Left;10 reps Goniometric ROM: L knee AROM: 3-62deg Other Exercises Other Exercises: Pt and spouse given education on positioning to prevent heel pressure and promote L knee PROM Other Exercises: Reviewed HEP packet exercises and frequency   Assessment/Plan    PT Assessment Patient needs continued PT services  PT Problem List Decreased strength;Decreased range of motion;Decreased activity tolerance;Decreased balance;Decreased knowledge of use of DME;Decreased mobility;Pain       PT Treatment Interventions DME instruction;Gait training;Stair training;Functional mobility training;Therapeutic activities;Patient/family education;Balance training;Therapeutic exercise    PT Goals (Current goals can be found in the Care Plan section)  Acute Rehab PT Goals Patient Stated Goal: To get stronger and walk better without pain PT Goal Formulation: With patient Time For Goal Achievement: 08/09/20 Potential to Achieve Goals: Fair    Frequency BID   Barriers to discharge        Co-evaluation               AM-PAC PT "6 Clicks" Mobility  Outcome Measure Help needed turning from your back to your side while in a flat bed without using bedrails?: None Help needed moving from lying on your back to sitting on the side of a flat bed without using bedrails?: None Help needed moving to and from a bed to a chair (including a wheelchair)?: A  Little Help needed standing up from a chair using your arms (e.g., wheelchair or bedside chair)?: A Little Help needed to walk in hospital room?: A Lot Help needed climbing 3-5 steps with a railing? : Total 6 Click Score: 17    End of Session Equipment Utilized During Treatment: Gait belt Activity Tolerance: Patient tolerated treatment well Patient left: in bed;with call bell/phone within reach;with  bed alarm set;with SCD's reapplied;with family/visitor present;Other (comment) (polarcare reapplied) Nurse Communication: Mobility status;Weight bearing status PT Visit Diagnosis: Unsteadiness on feet (R26.81);Muscle weakness (generalized) (M62.81);History of falling (Z91.81);Difficulty in walking, not elsewhere classified (R26.2);Pain Pain - Right/Left: Left Pain - part of body: Knee    Time: 1620-1700 PT Time Calculation (min) (ACUTE ONLY): 40 min   Charges:              Summit Borchardt SPT 07/27/20, 5:33 PM

## 2020-07-27 NOTE — Transfer of Care (Signed)
Immediate Anesthesia Transfer of Care Note  Patient: Julia Cooper  Procedure(s) Performed: COMPUTER ASSISTED TOTAL KNEE ARTHROPLASTY (Left Knee)  Patient Location: PACU  Anesthesia Type:General  Level of Consciousness: awake, alert  and oriented  Airway & Oxygen Therapy: Patient Spontanous Breathing and Patient connected to face mask oxygen  Post-op Assessment: Report given to RN and Post -op Vital signs reviewed and stable  Post vital signs: Reviewed and stable  Last Vitals:  Vitals Value Taken Time  BP 129/75 07/27/20 1057  Temp    Pulse 85 07/27/20 1059  Resp 15 07/27/20 1059  SpO2 99 % 07/27/20 1059  Vitals shown include unvalidated device data.  Last Pain:  Vitals:   07/27/20 0706  TempSrc: Temporal  PainSc: 6       Patients Stated Pain Goal: 0 (22/29/79 8921)  Complications: No complications documented.

## 2020-07-27 NOTE — Anesthesia Procedure Notes (Signed)
Spinal  Patient location during procedure: OR Staffing Performed: resident/CRNA  Resident/CRNA: Jerelle Virden Ben, CRNA Preanesthetic Checklist Completed: patient identified, IV checked, site marked, risks and benefits discussed, surgical consent, monitors and equipment checked, pre-op evaluation and timeout performed Spinal Block Patient position: sitting Prep: DuraPrep Patient monitoring: heart rate, cardiac monitor, continuous pulse ox and blood pressure Approach: midline Location: L3-4 Injection technique: single-shot Needle Needle type: Sprotte  Needle gauge: 24 G Needle length: 9 cm Assessment Sensory level: T4     

## 2020-07-28 LAB — GLUCOSE, CAPILLARY
Glucose-Capillary: 164 mg/dL — ABNORMAL HIGH (ref 70–99)
Glucose-Capillary: 170 mg/dL — ABNORMAL HIGH (ref 70–99)
Glucose-Capillary: 186 mg/dL — ABNORMAL HIGH (ref 70–99)
Glucose-Capillary: 193 mg/dL — ABNORMAL HIGH (ref 70–99)

## 2020-07-28 NOTE — Progress Notes (Signed)
Physical Therapy Treatment Patient Details Name: Julia Cooper MRN: [id number] DOB: Sep 02, 1951 Today's Date: 07/28/2020    History of Present Illness Pt is a 69 yo female diagnosed with degenerative arthrosis of the left knee and is s/p elective L TKA.  PMH includes: breast CA, cervical stenosis s/p surgical repair, depression, anxiety, DM, and HTN.    PT Comments    Pt was pleasant and motivated to participate during the session. Pt found in supine, reported 4/10 pain and stated had not had meds since breakfast; pt requested meds and RN was notified. Pt able to perform supine therex w/ min instructional cueing and no physical assist. Pt required supervision w/ supine-sit including extra time and effort. Pt performed multiple sit-to-stand transfers to RW throughout session w/ CGA; pt demonstrated significantly improved eccentric control w/ no LOB noted and min cueing for foot and hand placement. Pt ambulated 5 Feet with significantly improved gait and stability. After a rest, pt was able to ambulate 50 Feet x2 with a seated rest between repetitions. Pt primarily ambulated with a slightly antalgic step-to pattern but occasionally utilized a step through pattern w/ minimal difficulty; pt required CGA and mod cueing for management of RW. Pt demonstrated good ability shifting weight through BLE w/ min BUE support on RW; no LOB noted. Pt made significant progress with mobility since this mornings session and discharge recommendations have been updated. Pt will benefit from HHPT services upon discharge to safely address deficits listed in patient problem list for decreased caregiver assistance and eventual return to PLOF.   Follow Up Recommendations  Home health PT;Supervision for mobility/OOB     Equipment Recommendations  None recommended by PT    Recommendations for Other Services       Precautions / Restrictions Precautions Precautions: Fall Precaution Comments: Pt able to perform LLE  Ind SLR's without extensor lag, no KI required Restrictions Weight Bearing Restrictions: Yes LLE Weight Bearing: Weight bearing as tolerated    Mobility  Bed Mobility Overal bed mobility: Needs Assistance Bed Mobility: Supine to Sit     Supine to sit: Supervision;HOB elevated   General bed mobility comments: supine-to-sit extra time and effort  Transfers Overall transfer level: Needs assistance Equipment used: Rolling walker (2 wheeled) Transfers: Sit to/from Stand Sit to Stand: Min guard         General transfer comment: Min verbal cues for foot and hand positioning; good eccentric control this session w/ no LOB  Ambulation/Gait Ambulation/Gait assistance: Min guard Gait Distance (Feet): 50 Feet x2; 77ft Assistive device: Rolling walker (2 wheeled) Gait Pattern/deviations: Step-to pattern;Step-through pattern;Decreased step length - right;Decreased stance time - left;Antalgic Gait velocity: decreased   General Gait Details: Pt able to ambulate w/o physical assist; mod cueing for managment of RW; amulated with a step-to and occasional step-through pattern w/ minimal noted antalgia   Stairs             Wheelchair Mobility    Modified Rankin (Stroke Patients Only)       Balance Overall balance assessment: Needs assistance Sitting-balance support: Feet unsupported;Feet supported Sitting balance-Leahy Scale: Good     Standing balance support: Bilateral upper extremity supported;During functional activity Standing balance-Leahy Scale: Good Standing balance comment: good ability to weight shift through BLE; min lean on RW for support; pt steady throughout session w/ no LOB noted  Cognition Arousal/Alertness: Awake/alert Behavior During Therapy: WFL for tasks assessed/performed Overall Cognitive Status: Within Functional Limits for tasks assessed                                        Exercises Total  Joint Exercises Ankle Circles/Pumps: AROM;Strengthening;Both;10 reps Quad Sets: Strengthening;10 reps;Left;15 reps Gluteal Sets: Strengthening;Both;10 reps Straight Leg Raises: Left;10 reps;AROM Long Arc Quad: AROM;Strengthening;Left;10 reps Knee Flexion: AROM;Strengthening;Left;10 reps  Marching in Standing: AROM;Strengthening;Both;10 reps;Standing Other Exercises: Multiple sit to/from stand transfer training from various height surfaces with cues for proper sequencing     General Comments        Pertinent Vitals/Pain Pain Assessment: 0-10 Pain Score: 4  Pain Location: L knee Pain Descriptors / Indicators: Sore;Aching Pain Intervention(s): Limited activity within patient's tolerance;Monitored during session;Repositioned;Patient requesting pain meds-RN notified;RN gave pain meds during session;Ice applied    Home Living Family/patient expects to be discharged to:: Private residence Living Arrangements: Spouse/significant other Available Help at Discharge: Family;Available 24 hours/day Type of Home: House Home Access: Stairs to enter   Home Layout: One level Home Equipment: Environmental consultant - 2 wheels;Cane - single point;Shower seat;Bedside commode;Grab bars - tub/shower Additional Comments: Pt has 2WW and BSC borrowed from friend    Prior Function Level of Independence: Independent      Comments: Prior to admission, pt states able to walk community distances w/ difficulty (leans on grocery cart w/ shopping, occasional SPC use) and was limited by knee pain. Pt states occasionally needed help from husband to stand up from a low chair, but otherwise independent. Pt estimates 3x fall in the past year due to LOB / knee buckling   PT Goals (current goals can now be found in the care plan section) Acute Rehab PT Goals Patient Stated Goal: to be able to get around better with less pain Progress towards PT goals: Progressing toward goals    Frequency    BID      PT Plan Discharge  plan needs to be updated    Co-evaluation              AM-PAC PT "6 Clicks" Mobility   Outcome Measure  Help needed turning from your back to your side while in a flat bed without using bedrails?: A Little Help needed moving from lying on your back to sitting on the side of a flat bed without using bedrails?: A Little Help needed moving to and from a bed to a chair (including a wheelchair)?: A Little Help needed standing up from a chair using your arms (e.g., wheelchair or bedside chair)?: A Little Help needed to walk in hospital room?: A Little Help needed climbing 3-5 steps with a railing? : A Lot 6 Click Score: 17    End of Session Equipment Utilized During Treatment: Gait belt Activity Tolerance: Patient tolerated treatment well Patient left: in chair;with call bell/phone within reach;with chair alarm set;with nursing/sitter in room;with SCD's reapplied Nurse Communication: Mobility status;Weight bearing status;Patient requests pain meds PT Visit Diagnosis: Unsteadiness on feet (R26.81);Muscle weakness (generalized) (M62.81);History of falling (Z91.81);Difficulty in walking, not elsewhere classified (R26.2);Pain Pain - Right/Left: Left Pain - part of body: Knee     Time: 3570-1779 PT Time Calculation (min) (ACUTE ONLY): 38 min  Charges:  $Gait Training: 8-22 mins $Therapeutic Exercise: 8-22 mins $Therapeutic Activity: 8-22 mins  Jettie Mannor SPT 07/28/20, 3:53 PM

## 2020-07-28 NOTE — Evaluation (Signed)
Occupational Therapy Evaluation Patient Details Name: Julia Cooper MRN: [id number] DOB: 29-May-1951 Today's Date: 07/28/2020    History of Present Illness Pt is a 69 yo female diagnosed with degenerative arthrosis of the left knee and is s/p elective L TKA.  PMH includes: breast CA, cervical stenosis s/p surgical repair, depression, anxiety, DM, and HTN.    Clinical Impression   Pt seen for OT evaluation this date, POD#1 from above surgery. Pt was independent in all ADLs prior to surgery. Pt is eager to return to PLOF with less pain and improved safety and independence. Pt currently requires MOD A for LB dressing while in seated position due to pain and limited AROM of L knee and MIN A with RW for ADL transfers. Pt instructed in polar care mgt, falls prevention strategies, home/routines modifications, and DME/AE for LB bathing and dressing tasks. Pt would benefit from skilled OT services including additional instruction in dressing techniques with or without assistive devices for dressing and bathing skills to support recall and carryover prior to discharge and ultimately to maximize safety, independence, and minimize falls risk and caregiver burden. Anticipate pt could benefit from [facility] for LB ADLs in home setting upon d/c.     Follow Up Recommendations  Home health OT;Supervision - Intermittent    Equipment Recommendations  Other (comment) (reports she has gotten [facility] and 2WW from a friend to use while recovering)    Recommendations for Other Services       Precautions / Restrictions Precautions Precautions: Fall Precaution Comments: Pt able to perform LLE Ind SLR's without extensor lag, no KI required Restrictions Weight Bearing Restrictions: Yes LLE Weight Bearing: Weight bearing as tolerated      Mobility Bed Mobility Overal bed mobility: Needs Assistance Bed Mobility: Supine to Sit     Supine to sit: Supervision;HOB elevated Sit to supine: Min assist   General bed  mobility comments: use of bed rails, increased time to come to sitting using non-op LE to get leverage on EOB  Transfers Overall transfer level: Needs assistance Equipment used: Rolling walker (2 wheeled) Transfers: Sit to/from Stand Sit to Stand: Min assist              Balance Overall balance assessment: Needs assistance Sitting-balance support: Feet unsupported;Feet supported Sitting balance-Leahy Scale: Good     Standing balance support: Bilateral upper extremity supported;During functional activity Standing balance-Leahy Scale: Poor Standing balance comment: F static standing with B UE support, but no external support, P with taking steps from bed to chair ~3'                           ADL either performed or assessed with clinical judgement   ADL Overall ADL's : Needs assistance/impaired                                       General ADL Comments: Indep with UB ADLs seated, MOD A with LB dressing, AE education offered, handout issued.     Vision Patient Visual Report: No change from baseline       Perception     Praxis      Pertinent Vitals/Pain Pain Assessment: 0-10 Pain Score: 3  Pain Location: L knee Pain Descriptors / Indicators: Aching;Sore;Grimacing Pain Intervention(s): Monitored during session;Premedicated before session     Hand Dominance     Extremity/Trunk Assessment Upper Extremity  Assessment Upper Extremity Assessment: Overall WFL for tasks assessed   Lower Extremity Assessment Lower Extremity Assessment: Defer to PT evaluation;LLE deficits/detail LLE Deficits / Details: some expected post-op weakness, but pt bears weight appropriately for small steps from bed to chair with RW on OT assessment LLE Sensation: history of peripheral neuropathy (sensation at baseline per pt)   Cervical / Trunk Assessment Cervical / Trunk Assessment: Normal   Communication Communication Communication: No difficulties   Cognition  Arousal/Alertness: Awake/alert Behavior During Therapy: WFL for tasks assessed/performed Overall Cognitive Status: Within Functional Limits for tasks assessed                                     General Comments       Exercises Other Exercises Other Exercises: OT facilitates ed re: safety with ADL mobility, safe hand placement with RW, AE for LB ADLs, polar care mgt. Pt with good understanding. Handout issued.   Shoulder Instructions      Home Living Family/patient expects to be discharged to:: Private residence Living Arrangements: Spouse/significant other Available Help at Discharge: Family;Available 24 hours/day Type of Home: House Home Access: Stairs to enter [employer] of Steps: 2 stairs to enter w/ no rail; 4 stairs to enter w/ R rail   Home Layout: One level     Bathroom Shower/Tub: Occupational psychologist: Handicapped height Bathroom Accessibility: Yes   Home Equipment: Environmental consultant - 2 wheels;Cane - single point;Shower seat;Bedside commode;Grab bars - tub/shower   Additional Comments: Pt has 2WW and BSC borrowed from friend      Prior Functioning/Environment Level of Independence: Independent        Comments: Prior to admission, pt states able to walk community distances w/ difficulty (leans on grocery cart w/ shopping, occasional SPC use) and was limited by knee pain. Pt states occasionally needed help from husband to stand up from a low chair, but otherwise independent. Pt estimates 3x fall in the past year due to LOB / knee buckling        OT Problem List: Decreased strength;Decreased range of motion;Decreased activity tolerance;Impaired balance (sitting and/or standing);Decreased knowledge of use of DME or AE      OT Treatment/Interventions: Self-care/ADL training;Therapeutic exercise;DME and/or AE instruction;Therapeutic activities;Balance training;Patient/family education    OT Goals(Current goals can be found in the care  plan section) Acute Rehab OT Goals Patient Stated Goal: to be able to get around better with less pain OT Goal Formulation: With patient Time For Goal Achievement: 08/11/20 Potential to Achieve Goals: Good ADL Goals Pt Will Perform Lower Body Dressing: with supervision;with adaptive equipment;sit to/from stand Pt Will Transfer to Toilet: ambulating;with supervision (with RW to commode in restroom with BSC over top with LRAD) Additional ADL Goal #1: pt will verbalize appropriate mgt of polar care with 0% verbal cues.  OT Frequency: Min 2X/week   Barriers to D/C:            Co-evaluation              AM-PAC OT "6 Clicks" Daily Activity     Outcome Measure Help from another person eating meals?: None Help from another person taking care of personal grooming?: None Help from another person toileting, which includes using toliet, bedpan, or urinal?: A Little Help from another person bathing (including washing, rinsing, drying)?: A Little Help from another person to put on and taking off regular upper body  clothing?: None Help from another person to put on and taking off regular lower body clothing?: A Little 6 Click Score: 21   End of Session Equipment Utilized During Treatment: Gait belt;Rolling walker Nurse Communication: Mobility status  Activity Tolerance: Patient tolerated treatment well Patient left: in chair;with call bell/phone within reach;with chair alarm set  OT Visit Diagnosis: Unsteadiness on feet (R26.81);Muscle weakness (generalized) (M62.81)                Time: 8676-1950 OT Time Calculation (min): 54 min Charges:  OT General Charges $OT Visit: 1 Visit OT Evaluation $OT Eval Moderate Complexity: 1 Mod OT Treatments $Self Care/Home Management : 23-37 mins $Therapeutic Activity: 8-22 mins  Gerrianne Scale, MS, OTR/L ascom [phone] 07/28/20, 12:14 PM

## 2020-07-28 NOTE — Progress Notes (Signed)
Physical Therapy Treatment Patient Details Name: Julia Cooper MRN: [id number] DOB: September 15, 1951 Today's Date: 07/28/2020    History of Present Illness Pt is a 69 yo female diagnosed with degenerative arthrosis of the left knee and is s/p elective L TKA.  PMH includes: breast CA, cervical stenosis s/p surgical repair, depression, anxiety, DM, and HTN.     PT Comments    Pt was pleasant and motivated to participate during the session.  Pt put forth good effort during the session but was somewhat limited with functional mobility, most notably ambulation, this session secondary to nausea.  Pt required min A with bed mobility to manage her LLE into bed as well as min A during sit to/from stands.  Pt was able to ambulate twice this session but each time for a max of around 3 feet.  Pt ambulated with very short, effortful steps, with flexed trunk posture and close CGA.  Pt's SpO2 and HR were both WNL during the session. Pt will benefit from PT services in a SNF setting upon discharge to safely address deficits listed in patient problem list for decreased caregiver assistance and eventual return to PLOF.     Follow Up Recommendations  SNF     Equipment Recommendations  None recommended by PT    Recommendations for Other Services       Precautions / Restrictions Precautions Precautions: Fall Precaution Comments: Pt able to perform LLE Ind SLR's without extensor lag, no KI required Restrictions Weight Bearing Restrictions: Yes LLE Weight Bearing: Weight bearing as tolerated    Mobility  Bed Mobility Overal bed mobility: Needs Assistance Bed Mobility: Supine to Sit     Supine to sit: Supervision;HOB elevated Sit to supine: Min assist   General bed mobility comments: Min A required for LLE control  Transfers Overall transfer level: Needs assistance Equipment used: Rolling walker (2 wheeled) Transfers: Sit to/from Stand Sit to Stand: Min assist         General transfer  comment: Min to mod verbal cues for foot positioning and min A to come to full standing and for eccentric control during stand to sit  Ambulation/Gait Ambulation/Gait assistance: Min guard Gait Distance (Feet): 3 Feet x 2 Assistive device: Rolling walker (2 wheeled) Gait Pattern/deviations: Decreased step length - right;Step-to pattern;Trunk flexed;Decreased stance time - left;Antalgic Gait velocity: decreased   General Gait Details: Pt able to amb without physical assist but required mod verbal cuing for sequencing and ambulated with very slow, effortful, angtalgic step-to pattern   Stairs             Wheelchair Mobility    Modified Rankin (Stroke Patients Only)       Balance Overall balance assessment: Needs assistance Sitting-balance support: Feet unsupported;Feet supported Sitting balance-Leahy Scale: Good     Standing balance support: Bilateral upper extremity supported;During functional activity Standing balance-Leahy Scale: Fair Standing balance comment: No LOB during amb but pt required mod lean on the RW for support                            Cognition Arousal/Alertness: Awake/alert Behavior During Therapy: WFL for tasks assessed/performed Overall Cognitive Status: Within Functional Limits for tasks assessed                                        Exercises Total Joint Exercises Ankle Circles/Pumps:  AROM;Strengthening;Both;10 reps Quad Sets: Strengthening;10 reps;Left;15 reps Gluteal Sets: Strengthening;Both;10 reps Hip ABduction/ADduction: AAROM;Left;10 reps;AROM Straight Leg Raises: AAROM;Left;10 reps;AROM Long Arc Quad: AROM;Strengthening;Left;10 reps;15 reps Knee Flexion: AROM;Strengthening;Left;10 reps;15 reps Goniometric ROM: L knee AROM: 2-71 deg Marching in Standing: AROM;Strengthening;Both;5 reps;Standing Other Exercises Other Exercises: Reviewed HEP packet exercises and frequency Other Exercises: Multiple sit  to/from stand transfer training from various height surfaces with cues for proper sequencing Other Exercises: Positioning review to encourage L knee ext PROM and prevent heel pressure    General Comments        Pertinent Vitals/Pain Pain Assessment: 0-10 Pain Score: 3  Pain Location: L knee Pain Descriptors / Indicators: Sore Pain Intervention(s): Premedicated before session;Monitored during session    Home Living Family/patient expects to be discharged to:: Private residence Living Arrangements: Spouse/significant other Available Help at Discharge: Family;Available 24 hours/day Type of Home: House Home Access: Stairs to enter   Home Layout: One level Home Equipment: Environmental consultant - 2 wheels;Cane - single point;Shower seat;Bedside commode;Grab bars - tub/shower Additional Comments: Pt has 2WW and BSC borrowed from friend    Prior Function Level of Independence: Independent      Comments: Prior to admission, pt states able to walk community distances w/ difficulty (leans on grocery cart w/ shopping, occasional SPC use) and was limited by knee pain. Pt states occasionally needed help from husband to stand up from a low chair, but otherwise independent. Pt estimates 3x fall in the past year due to LOB / knee buckling   PT Goals (current goals can now be found in the care plan section) Acute Rehab PT Goals Patient Stated Goal: to be able to get around better with less pain Progress towards PT goals: Progressing toward goals    Frequency    BID      PT Plan Current plan remains appropriate    Co-evaluation              AM-PAC PT "6 Clicks" Mobility   Outcome Measure  Help needed turning from your back to your side while in a flat bed without using bedrails?: A Little Help needed moving from lying on your back to sitting on the side of a flat bed without using bedrails?: A Little Help needed moving to and from a bed to a chair (including a wheelchair)?: A Little Help  needed standing up from a chair using your arms (e.g., wheelchair or bedside chair)?: A Little Help needed to walk in hospital room?: A Lot Help needed climbing 3-5 steps with a railing? : Total 6 Click Score: 15    End of Session Equipment Utilized During Treatment: Gait belt Activity Tolerance: Patient tolerated treatment well;Other (comment) Patient left: in bed;with call bell/phone within reach;with bed alarm set;with SCD's reapplied;Other (comment) (Polar care donned to L knee) Nurse Communication: Mobility status;Weight bearing status PT Visit Diagnosis: Unsteadiness on feet (R26.81);Muscle weakness (generalized) (M62.81);History of falling (Z91.81);Difficulty in walking, not elsewhere classified (R26.2);Pain Pain - Right/Left: Left Pain - part of body: Knee     Time: 4098-1191 PT Time Calculation (min) (ACUTE ONLY): 39 min  Charges:  $Gait Training: 8-22 mins $Therapeutic Exercise: 8-22 mins $Therapeutic Activity: 8-22 mins                     D. Scott Ediberto Sens PT, DPT 07/28/20, 1:51 PM

## 2020-07-28 NOTE — Care Management Important Message (Signed)
Important Message  Patient Details  Name: Julia Cooper MRN: [id number] Date of Birth: [birth year]   Medicare Important Message Given:  Yes   Initial Medicare IM given by Patient Access Associate on 07/28/2020 at 11:14am.     Dannette Barbara 07/28/2020, 2:18 PM

## 2020-07-28 NOTE — Discharge Summary (Signed)
Physician Discharge Summary  Patient ID: [name] MRN: [id number] DOB/AGE: [birth year] 68 y.o.  Admit date: 07/27/2020 Discharge date: 07/29/2020  Admission Diagnoses:  Total knee replacement status [Z96.659]  Surgeries:Procedure(s):  Left total knee arthroplasty using computer-assisted navigation  SURGEON:  Julia Cooper. M.D.  ASSISTANT: Julia Smiles, PA-C (present and scrubbed throughout the case, critical for assistance with exposure, retraction, instrumentation, and closure)  ANESTHESIA: spinal  ESTIMATED BLOOD LOSS: 50 mL  FLUIDS REPLACED: 1100 mL of crystalloid  TOURNIQUET TIME: 90 minutes  DRAINS: 2 medium Hemovac drains  SOFT TISSUE RELEASES: Anterior cruciate ligament, posterior cruciate ligament, deep medial collateral ligament, patellofemoral ligament  IMPLANTS UTILIZED: DePuy Attune size 6N posterior stabilized femoral component (cemented), size 5 rotating platform tibial component (cemented), 35 mm medialized dome patella (cemented), and a 7 mm stabilized rotating platform polyethylene insert.  Discharge Diagnoses: Patient Active Problem List   Diagnosis Date Noted  . Total knee replacement status 07/27/2020  . Primary osteoarthritis of left knee 02/14/2020  . Intraductal papilloma of breast, left 10/07/2018  . Neuropathy 07/30/2018  . Type 2 diabetes mellitus with diabetic polyneuropathy, with long-term current use of insulin ([place]) 05/18/2014  . Cervical spinal stenosis 03/29/2014  . Depression with anxiety 03/29/2014  . Hypertension associated with diabetes ([place]) 03/29/2014  . Non morbid obesity due to excess calories 03/29/2014    Past Medical History:  Diagnosis Date  . Anxiety   . Arthritis   . Cancer (HCC)    SKIN CANCER-MOHS  . Diabetes mellitus without complication ([place])   . Family history of adverse reaction to anesthesia    father  N/V  . GERD (gastroesophageal reflux disease)    OCC  . Hypertension   . PONV  (postoperative nausea and vomiting)    HARD TO WAKE UP  . Tongue burning sensation      Transfusion:    Consultants (if any):   Discharged Condition: Improved  Hospital Course: Julia Cooper is an 69 y.o. female who was admitted 07/27/2020 with a diagnosis of left knee osteoarthritis and went to the operating room on 07/27/2020 and underwent left total knee arthroplasty. The patient received perioperative antibiotics for prophylaxis (see below). The patient tolerated the procedure well and was transported to PACU in stable condition. After meeting PACU criteria, the patient was subsequently transferred to the Orthopaedics/Rehabilitation unit.   The patient received DVT prophylaxis in the form of early mobilization, Lovenox, Foot Pumps and TED hose. A sacral pad had been placed and heels were elevated off of the bed with rolled towels in order to protect skin integrity. Foley catheter was discontinued on postoperative day #0. Wound drains were discontinued on postoperative day #2. The surgical incision was healing well without signs of infection.  Physical therapy was initiated postoperatively for transfers, gait training, and strengthening. Occupational therapy was initiated for activities of daily living and evaluation for assisted devices. Rehabilitation goals were reviewed in detail with the patient. The patient made steady progress with physical therapy and physical therapy recommended discharge to Home.   The patient achieved the preliminary goals of this hospitalization and was felt to be medically and orthopaedically appropriate for discharge.  She was given perioperative antibiotics:  Anti-infectives (From admission, onward)   Start     Dose/Rate Route Frequency Ordered Stop   07/27/20 1500  ceFAZolin (ANCEF) IVPB 2g/100 mL premix        2 g 200 mL/hr over 30 Minutes Intravenous Every 6 hours 07/27/20 1429 07/28/20 0904  07/27/20 0615  ceFAZolin (ANCEF) IVPB 2g/100 mL premix         2 g 200 mL/hr over 30 Minutes Intravenous On call to O.R. 07/27/20 0602 07/27/20 0751    .  Recent vital signs:  Vitals:   07/28/20 2320 07/29/20 0747  BP: 128/72 (!) 141/79  Pulse: 86 87  Resp: 16 16  Temp: 98.8 F (37.1 C) 98.3 F (36.8 C)  SpO2: 97% 95%    Recent laboratory studies:  Recent Labs    07/26/20 1612 07/27/20 0632  WBC 6.6  --   HGB 13.4 12.6  HCT 39.1 37.0  PLT 284  --   K  --  3.3*  CL  --  92*  BUN  --  16  CREATININE  --  0.70  GLUCOSE  --  216*  INR 0.9  --     Diagnostic Studies: DG Knee Left Port  Result Date: 07/27/2020 CLINICAL DATA:  Postop EXAM: PORTABLE LEFT KNEE - 1-2 VIEW COMPARISON:  May 05, 2019 FINDINGS: Status post total knee arthroplasty. Orthopedic hardware is intact and without periprosthetic fracture or lucency. Soft tissue air. Surgical drain. Surgical staples. Ghost screw tracks along the proximal tibia and distal femur. Trace knee joint effusion. IMPRESSION: Status post total knee arthroplasty with expected postsurgical changes. Electronically Signed   By: Julia Saxon MD   On: 07/27/2020 11:22    Discharge Medications:   Allergies as of 07/29/2020      Reactions   Ace Inhibitors Cough   Headache   Ciprofloxacin Other (See Comments)   Not sure. May have caused yeast infection   Doxazosin Cough   Other Other (See Comments)   Anesthesia-nausea   Amlodipine Other (See Comments)   Unknown      Medication List    STOP taking these medications   meloxicam 15 MG tablet Commonly known as: MOBIC     TAKE these medications   acetaminophen 500 MG tablet Commonly known as: TYLENOL Take 1,000 mg by mouth every 8 (eight) hours as needed for moderate pain.   calcium carbonate 500 MG chewable tablet Commonly known as: TUMS - dosed in mg elemental calcium Chew 1,000 mg by mouth daily as needed for indigestion or heartburn.   celecoxib 200 MG capsule Commonly known as: CELEBREX Take 1 capsule (200 mg total) by mouth  2 (two) times daily.   enoxaparin 40 MG/0.4ML injection Commonly known as: LOVENOX Inject 0.4 mLs (40 mg total) into the skin daily for 14 days.   escitalopram 10 MG tablet Commonly known as: LEXAPRO Take 10 mg by mouth daily at 8 pm.   HumuLIN N KwikPen 100 UNIT/ML Kiwkpen Generic drug: Insulin NPH (Human) ([place]) Inject 40 Units into the skin at bedtime.   hydrALAZINE 50 MG tablet Commonly known as: APRESOLINE Take 50 mg by mouth 2 (two) times a day.   losartan-hydrochlorothiazide 100-25 MG tablet Commonly known as: HYZAAR Take 1 tablet by mouth every morning.   magnesium oxide 400 MG tablet Commonly known as: MAG-OX Take 400 mg by mouth daily after lunch.   metformin 1000 MG (OSM) 24 hr tablet Commonly known as: FORTAMET Take 1,000 mg by mouth 2 (two) times daily with a meal.   multivitamin with minerals tablet Take 1 tablet by mouth daily.   oxyCODONE 5 MG immediate release tablet Commonly known as: Oxy IR/ROXICODONE Take 1 tablet (5 mg total) by mouth every 4 (four) hours as needed for moderate pain (pain score 4-6).  Ozempic (1 MG/DOSE) 4 MG/3ML Sopn Generic drug: Semaglutide (1 MG/DOSE) Inject 1 mg into the skin every Monday.   potassium chloride SA 20 MEQ tablet Commonly known as: KLOR-CON Take 20 mEq by mouth at bedtime.   traMADol 50 MG tablet Commonly known as: ULTRAM Take 1 tablet (50 mg total) by mouth every 4 (four) hours as needed for moderate pain.   Vitamin D3 50 MCG (2000 UT) Tabs Take 2,000 Units by mouth daily after lunch.            Durable Medical Equipment  (From admission, onward)         Start     Ordered   07/27/20 1430  DME Walker rolling  Once       Question:  Patient needs a walker to treat with the following condition  Answer:  Total knee replacement status   07/27/20 1429   07/27/20 1430  DME Bedside commode  Once       Question:  Patient needs a bedside commode to treat with the following condition  Answer:  Total  knee replacement status   07/27/20 1429          Disposition: home with home health PT      Follow-up Information    Watt Climes, [place] On 08/10/2020.   Specialty: Physician Assistant Why: at 9:45am Contact information: [place] [address] [phone]        Dereck Leep, MD On 09/13/2020.   Specialty: Orthopedic Surgery Why: at 9:15am Contact information: [place] [address] [place], PA-C 07/29/2020, 12:06 PM

## 2020-07-28 NOTE — Progress Notes (Signed)
Pt up OOB to use the BSC this AM. Min assist of one to EOB but once pt settled at side she was able to stand up on her own.  Once up she used RW to pivot over to [facility] where she voided.  Back to bed with only queues and min assist to help with getting L leg back into bed.  Pain medication given at this time as pt reported 8/10 pain with activity.

## 2020-07-28 NOTE — TOC Initial Note (Signed)
Transition of Care [facility]) - Initial/Assessment Note    Patient Details  Name: Julia Cooper MRN: [id number] Date of Birth: 17-Jul-1951  [facility]) CM/SW Contact:    Candie Chroman, LCSW Phone Number: 07/28/2020, 3:46 PM  Clinical Narrative:  CSW met with patient. No supports at bedside. CSW introduced role and explained that PT recommendations would be discussed. Patient reports doing better with PT this afternoon and so she prefers to return home with home health. She is already set up with [facility]. Patient plans to discharge home tomorrow and [facility] will come out to see her on Saturday. Her husband will be with her at home. Some friends are letting her borrow a bedside commode and rolling walker. No further concerns. CSW encouraged patient to contact CSW as needed. CSW will continue to follow patient for support and facilitate return home when stable.         Expected Discharge Plan: [place] Barriers to Discharge: Continued Medical Work up   Patient Goals and CMS Choice     Choice offered to / list presented to : NA  Expected Discharge Plan and Services Expected Discharge Plan: [place] Choice: [place] arrangements for the past 2 months: [place]: PT, OT [place] Agency: [facility] (formerly [employer]) Date [place]: 07/28/20   Representative spoke with at [place]: Drue Novel  Prior Living Arrangements/Services Living arrangements for the past 2 months: [place] with:: Spouse Patient language and need for interpreter reviewed:: Yes Do you feel safe going back to the place where you live?: Yes      Need for Family Participation in Patient Care: Yes (Comment) Care giver support system in place?: Yes (comment)   Criminal Activity/Legal Involvement Pertinent to Current  Situation/Hospitalization: No - Comment as needed  Activities of Daily Living Home Assistive Devices/Equipment: CBG Meter, Cane (specify quad or straight), Walker (specify type) (single point cane front wheel walker) ADL Screening (condition at time of admission) Patient's cognitive ability adequate to safely complete daily activities?: Yes Is the patient deaf or have difficulty hearing?: No Does the patient have difficulty seeing, even when wearing glasses/contacts?: No Does the patient have difficulty concentrating, remembering, or making decisions?: No Patient able to express need for assistance with ADLs?: Yes Does the patient have difficulty dressing or bathing?: No Independently performs ADLs?: Yes (appropriate for developmental age) Does the patient have difficulty walking or climbing stairs?: Yes Weakness of Legs: Left Weakness of Arms/Hands: None  Permission Sought/Granted Permission sought to share information with : Facility Art therapist granted to share information with : Yes, Verbal Permission Granted     Permission granted to share info w AGENCY: [facility]        Emotional Assessment Appearance:: Appears stated age Attitude/Demeanor/Rapport: Engaged, Gracious Affect (typically observed): Accepting, Appropriate, Calm, Pleasant Orientation: : Oriented to Self, Oriented to Place, Oriented to  Time, Oriented to Situation Alcohol / Substance Use: Not Applicable Psych Involvement: No (comment)  Admission diagnosis:  Total knee replacement status [Z96.659] Patient Active Problem List   Diagnosis Date Noted  . Total knee replacement status 07/27/2020  . Primary osteoarthritis of left knee 02/14/2020  . Intraductal papilloma of  breast, left 10/07/2018  . Neuropathy 07/30/2018  . Type 2 diabetes mellitus with diabetic polyneuropathy, with long-term current use of insulin ([place]) 05/18/2014  . Cervical spinal stenosis 03/29/2014  . Depression with  anxiety 03/29/2014  . Hypertension associated with diabetes ([place]) 03/29/2014  . Non morbid obesity due to excess calories 03/29/2014   PCP:  Dion Body, MD Pharmacy:   [facility] [phone] [address], [place] [address] Phone: [phone] Fax: [phone]     Social Determinants of Health (SDOH) Interventions    Readmission Risk Interventions No flowsheet data found.

## 2020-07-28 NOTE — Progress Notes (Signed)
  Subjective: 1 Day Post-Op Procedure(s) (LRB): COMPUTER ASSISTED TOTAL KNEE ARTHROPLASTY (Left) Patient reports pain as moderate.   Patient is well, but has had some minor complaints of needing to urinate.  Plan is to go Home after hospital stay. Negative for chest pain and shortness of breath Fever: no Gastrointestinal: negative for nausea and vomiting.   Patient has not had a bowel movement.  Objective: Vital signs in last 24 hours: Temp:  [97 F (36.1 C)-98.3 F (36.8 C)] 98.3 F (36.8 C) (08/12 0739) Pulse Rate:  [69-92] 88 (08/12 0739) Resp:  [11-27] 16 (08/12 0739) BP: (117-154)/(65-89) 122/65 (08/12 0739) SpO2:  [91 %-99 %] 96 % (08/12 0739)  Intake/Output from previous day:  Intake/Output Summary (Last 24 hours) at 07/28/2020 0838 Last data filed at 07/28/2020 0425 Gross per 24 hour  Intake 2974.84 ml  Output 630 ml  Net 2344.84 ml    Intake/Output this shift: No intake/output data recorded.  Labs: Recent Labs    07/26/20 1612 07/27/20 0632  HGB 13.4 12.6   Recent Labs    07/26/20 1612 07/27/20 0632  WBC 6.6  --   RBC 4.49  --   HCT 39.1 37.0  PLT 284  --    Recent Labs    07/25/20 1305 07/27/20 0632  NA 130* 132*  K 3.9 3.3*  CL 92* 92*  CO2 27  --   BUN 11 16  CREATININE 0.70 0.70  GLUCOSE 208* 216*  CALCIUM 8.7*  --    Recent Labs    07/26/20 1612  INR 0.9     EXAM General - Patient is Alert, Appropriate and Oriented Extremity - Neurovascular intact Dorsiflexion/Plantar flexion intact Compartment soft; decreased sensation over saphenous, lat sural cutaneous, and superficial fibular; absent over deep fibular--this is patient's baseline Dressing/Incision -Postoperative dressing remains in place., Polar Care in place and working. , Hemovac in place.  Motor Function - intact, moving foot and toes well on exam.  Cardiovascular- Regular rate and rhythm, no murmurs/rubs/gallops Respiratory- Lungs clear to auscultation  bilaterally Gastrointestinal- soft, nontender and hypoactive bowel sounds   Assessment/Plan: 1 Day Post-Op Procedure(s) (LRB): COMPUTER ASSISTED TOTAL KNEE ARTHROPLASTY (Left) Active Problems:   Total knee replacement status  Estimated body mass index is 36.48 kg/m as calculated from the following:   Height as of this encounter: 5\' 6"  (1.676 m).   Weight as of this encounter: 102.5 kg. Advance diet Up with therapy Plan for discharge tomorrow  Spoke with nursing regarding getting patient up to use the restroom.  DVT Prophylaxis - Lovenox, Ted hose and foot pumps Weight-Bearing as tolerated to left leg  Cassell Smiles, PA-C [facility] Orthopaedic Surgery 07/28/2020, 8:38 AM

## 2020-07-29 LAB — GLUCOSE, CAPILLARY
Glucose-Capillary: 129 mg/dL — ABNORMAL HIGH (ref 70–99)
Glucose-Capillary: 202 mg/dL — ABNORMAL HIGH (ref 70–99)

## 2020-07-29 MED ORDER — CELECOXIB 200 MG PO CAPS: 200.0000 mg | ORAL_CAPSULE | Freq: Two times a day (BID) | ORAL | 0 refills | Status: DC

## 2020-07-29 MED ORDER — ENOXAPARIN SODIUM 40 MG/0.4ML ~~LOC~~ SOLN
40.0000 mg | SUBCUTANEOUS | 0 refills | Status: DC
Start: 2020-07-29 — End: 2022-03-12

## 2020-07-29 MED ORDER — TRAMADOL HCL 50 MG PO TABS: 50.0000 mg | ORAL_TABLET | ORAL | 0 refills | Status: DC | PRN

## 2020-07-29 MED ORDER — OXYCODONE HCL 5 MG PO TABS: 5.0000 mg | ORAL_TABLET | ORAL | 0 refills | Status: DC | PRN

## 2020-07-29 NOTE — Progress Notes (Signed)
Discharge summary reviewed with verbal understanding. Answered all questions. Thigh high TED to be placed before discharge.

## 2020-07-29 NOTE — Progress Notes (Signed)
Occupational Therapy Treatment Patient Details Name: Julia Cooper MRN: [id number] DOB: November 27, 1951 Today's Date: 07/29/2020    History of present illness Pt is a 69 yo female diagnosed with degenerative arthrosis of the left knee and is s/p elective L TKA. PMH includes: breast CA, cervical stenosis s/p surgical repair, depression, anxiety, DM, and HTN.   OT comments  Pt and spouse both available for session and agreeable to education. Provided info, demonstrated, and allowed pt/family to teach back re: polar care mgmt, compression hose mgmt, LB bathing and dressing techniques. Pt/family stated that they felt confident in their ability to manage ADL at home and agreed that no further OT services were needed at this time.    Follow Up Recommendations  No OT follow up    Equipment Recommendations  Other (comment) (pt has all needed equipment at home)    Recommendations for Other Services      Precautions / Restrictions Precautions Precaution Comments: WBAT, no pillow under knee, keep heels elevated Restrictions Weight Bearing Restrictions: Yes LLE Weight Bearing: Weight bearing as tolerated       Mobility Bed Mobility         Supine to sit: Min guard Sit to supine: Min guard      Transfers Overall transfer level: Needs assistance Equipment used: Rolling walker (2 wheeled)                  Balance                                           ADL either performed or assessed with clinical judgement   ADL Overall ADL's : Needs assistance/impaired             Lower Body Bathing: Min guard;With adaptive equipment       Lower Body Dressing: Minimal assistance;With adaptive equipment   Toilet Transfer: Min guard                   Vision       Perception     Praxis      Cognition Arousal/Alertness: Awake/alert Behavior During Therapy: WFL for tasks assessed/performed                                             Exercises Other Exercises Other Exercises: provided education to pt and spouse re: LB dressing, LB bathing, polar care mgmt., compression hose mgmt.   Shoulder Instructions       General Comments      Pertinent Vitals/ Pain       Pain Score: 2  Pain Intervention(s): Monitored during session;Ice applied  Home Living                                          Prior Functioning/Environment              Frequency           Progress Toward Goals  OT Goals(current goals can now be found in the care plan section)  Progress towards OT goals: Progressing toward goals  Acute Rehab OT Goals Patient Stated Goal: home with pain-free knee OT Goal Formulation: With  patient Potential to Achieve Goals: Good  Plan All goals met and education completed, patient discharged from OT services    Co-evaluation                 AM-PAC OT "6 Clicks" Daily Activity     Outcome Measure   Help from another person eating meals?: None Help from another person taking care of personal grooming?: None Help from another person toileting, which includes using toliet, bedpan, or urinal?: A Little Help from another person bathing (including washing, rinsing, drying)?: A Little Help from another person to put on and taking off regular upper body clothing?: None Help from another person to put on and taking off regular lower body clothing?: A Little 6 Click Score: 21    End of Session    OT Visit Diagnosis: Other abnormalities of gait and mobility (R26.89)   Activity Tolerance Patient tolerated treatment well   Patient Left in chair;with family/visitor present;with call bell/phone within reach   Nurse Communication          Time: 5053-9767 OT Time Calculation (min): 17 min  Charges: OT General Charges $OT Visit: 1 Visit OT Treatments $Self Care/Home Management : 8-22 mins    Josiah Lobo, PhD, OTR/L 07/29/2020, 10:30 AM

## 2020-07-29 NOTE — TOC Transition Note (Signed)
Transition of Care [facility]) - CM/SW Discharge Note   Patient Details  Name: Julia Cooper MRN: [id number] Date of Birth: 30-Apr-1951  [facility]) CM/SW Contact:  Candie Chroman, LCSW Phone Number: 07/29/2020, 12:10 PM   Clinical Narrative:  Patient has orders to discharge home today. Left message for [facility] representative to notify. No further concerns. CSW signing off.   Final next level of care: [place] Barriers to Discharge: Barriers Resolved   Patient Goals and CMS Choice     Choice offered to / list presented to : NA  Discharge Placement                    Patient and family notified of of transfer: 07/29/20  Discharge Plan and Services     Post Acute Care Choice: [place]: PT, OT [place] Agency: [facility] (formerly [employer]) Date [place]: 07/28/20   Representative spoke with at [place]: [place] ([place]) Interventions     Readmission Risk Interventions No flowsheet data found.

## 2020-07-29 NOTE — Progress Notes (Signed)
Physical Therapy Treatment Patient Details Name: Julia Cooper MRN: [id number] DOB: [birth year] Today's Date: 07/29/2020    History of Present Illness Pt is a 69 yo female diagnosed with degenerative arthrosis of the left knee and is s/p elective L TKA. PMH includes: breast CA, cervical stenosis s/p surgical repair, depression, anxiety, DM, and HTN.    PT Comments    Pt was pleasant and motivated to participate during the session. Pt found in recliner, reported 1/10 pain and stated recently received pain meds. Pt's L knee AROM measured this a.m. as 0-76deg. Pt able to perform seated exercises w/ min instructional cueing and no physical assist. Pt required supervision for sit-to-stand w/ no need for cueing. Pt was able to ambulate 144ftx2 w/ RW and CGA; began ambulation w/ a step-to pattern and self-progressed to a step-through pattern w/ slight antalgic gait. Pt had one episode of self-reported "knee catching" but no LOB was noted and pt was able to resume ambulation w/ no need for physical assist.  Pt and caregiver given instruction for stair sequencing; pt able to ascend/descend 4 stairs x2 w/ use of R rail and CGA as well as min instructional cueing (overall good retention of sequencing). Pt returned to room and returned to sit in recliner. Pt and caregiver given education on sequencing for car transfers for a safe d/c home; verbalized understanding. Pt will benefit from HHPT services upon discharge to safely address deficits listed in patient problem list for decreased caregiver assistance and eventual return to PLOF.   Follow Up Recommendations  Home health PT;Supervision for mobility/OOB     Equipment Recommendations  None recommended by PT    Recommendations for Other Services       Precautions / Restrictions Precautions Precautions: Fall Precaution Comments:  Restrictions Weight Bearing Restrictions: Yes LLE Weight Bearing: Weight bearing as tolerated    Mobility  Bed  Mobility         Supine to sit: Min guard Sit to supine: Min guard   General bed mobility comments: deferred - pt found and returned to recliner  Transfers Overall transfer level: Needs assistance Equipment used: Rolling walker (2 wheeled) Transfers: Sit to/from Stand Sit to Stand: Supervision         General transfer comment: good eccentric control w/ no LOB  Ambulation/Gait Ambulation/Gait assistance: Min guard Gait Distance (Feet): 120 Feetx2 Assistive device: Rolling walker (2 wheeled) Gait Pattern/deviations: Step-to pattern;Step-through pattern;Decreased step length - right;Decreased stance time - left;Antalgic Gait velocity: decreased   General Gait Details: Pt began ambulation w/ a step-to pattern and progressed to a step-through pattern; pt had one episode of "knee catching" but no LOB noted and no need for physical assist.   Stairs Stairs: Yes Stairs assistance: Min guard Stair Management: One rail Right;Step to pattern;Forwards Number of Stairs: 4x2 General stair comments: pt able to ascend/descend forwards w/ a step-to gait pattern and min instructional cueing; good retention of sequencing   Wheelchair Mobility    Modified Rankin (Stroke Patients Only)       Balance Overall balance assessment: Needs assistance Sitting-balance support: Feet unsupported;Feet supported Sitting balance-Leahy Scale: Good     Standing balance support: Bilateral upper extremity supported;During functional activity Standing balance-Leahy Scale: Good Standing balance comment: good ability to weight shift through BLE; min lean on RW for support; pt steady throughout session w/ no LOB noted  Cognition Arousal/Alertness: Awake/alert Behavior During Therapy: WFL for tasks assessed/performed Overall Cognitive Status: Within Functional Limits for tasks assessed                                        Exercises Total  Joint Exercises Ankle Circles/Pumps: AROM;Strengthening;Both;10 reps Quad Sets: Strengthening;10 reps;Left;15 reps Gluteal Sets: Strengthening;Both;10 reps Long Arc Quad: AROM;Strengthening;Left;10 reps;5 reps Knee Flexion: AROM;Strengthening;Left;10 reps;5 reps Goniometric ROM: L knee AROM: 0-76deg Marching in Standing: AROM;Strengthening;Both;10 reps;Standing  Other Exercises: Pt and caregiver given instruction on sequencing for stairs w/ R rail; demonstrated and verbalized understanding Other Exercises: Pt and caregiver given education on sequencing for car tranfers; verbalized understanding    General Comments        Pertinent Vitals/Pain Pain Assessment: 0-10 Pain Score: 1  Pain Location: L knee Pain Descriptors / Indicators: Sore;Aching Pain Intervention(s): Limited activity within patient's tolerance;Monitored during session;Premedicated before session;Repositioned;Ice applied    Home Living                      Prior Function            PT Goals (current goals can now be found in the care plan section) Acute Rehab PT Goals Patient Stated Goal: home with pain-free knee Progress towards PT goals: Progressing toward goals    Frequency    BID      PT Plan Current plan remains appropriate    Co-evaluation              AM-PAC PT "6 Clicks" Mobility   Outcome Measure  Help needed turning from your back to your side while in a flat bed without using bedrails?: A Little Help needed moving from lying on your back to sitting on the side of a flat bed without using bedrails?: A Little Help needed moving to and from a bed to a chair (including a wheelchair)?: A Little Help needed standing up from a chair using your arms (e.g., wheelchair or bedside chair)?: A Little Help needed to walk in hospital room?: A Little Help needed climbing 3-5 steps with a railing? : A Little 6 Click Score: 18    End of Session Equipment Utilized During Treatment: Gait  belt Activity Tolerance: Patient tolerated treatment well Patient left: in chair;with call bell/phone within reach;with chair alarm set;with SCD's reapplied;with family/visitor present;Other (comment) (polarcare reapplied) Nurse Communication: Mobility status;Weight bearing status PT Visit Diagnosis: Unsteadiness on feet (R26.81);Muscle weakness (generalized) (M62.81);History of falling (Z91.81);Difficulty in walking, not elsewhere classified (R26.2);Pain Pain - Right/Left: Left Pain - part of body: Knee     Time: 2683-4196 PT Time Calculation (min) (ACUTE ONLY): 33 min  Charges:        Mistee Soliman SPT 07/29/20, 1:17 PM

## 2020-07-29 NOTE — Progress Notes (Signed)
  Subjective: 2 Days Post-Op Procedure(s) (LRB): COMPUTER ASSISTED TOTAL KNEE ARTHROPLASTY (Left) Patient reports pain as well-controlled.   Patient is well, and has had no acute complaints or problems Plan is to go Home after hospital stay. Negative for chest pain and shortness of breath Fever: no Gastrointestinal: negative for nausea and vomiting.   Patient has not had a bowel movement.  Objective: Vital signs in last 24 hours: Temp:  [97.6 F (36.4 C)-98.8 F (37.1 C)] 98.3 F (36.8 C) (08/13 0747) Pulse Rate:  [75-87] 87 (08/13 0747) Resp:  [16-19] 16 (08/13 0747) BP: (95-141)/(58-79) 141/79 (08/13 0747) SpO2:  [95 %-100 %] 95 % (08/13 0747)  Intake/Output from previous day:  Intake/Output Summary (Last 24 hours) at 07/29/2020 0826 Last data filed at 07/29/2020 0444 Gross per 24 hour  Intake 1171.33 ml  Output 160 ml  Net 1011.33 ml    Intake/Output this shift: No intake/output data recorded.  Labs: Recent Labs    07/26/20 1612 07/27/20 0632  HGB 13.4 12.6   Recent Labs    07/26/20 1612 07/27/20 0632  WBC 6.6  --   RBC 4.49  --   HCT 39.1 37.0  PLT 284  --    Recent Labs    07/27/20 0632  NA 132*  K 3.3*  CL 92*  BUN 16  CREATININE 0.70  GLUCOSE 216*   Recent Labs    07/26/20 1612  INR 0.9     EXAM General - Patient is Alert, Appropriate and Oriented Extremity - Neurovascular intact Dorsiflexion/Plantar flexion intact Compartment soft Dressing/Incision -Postoperative dressing remains in place., Polar Care in place and working. , Hemovac in place. Following removal of dressin,g minimal sanguinous drainage noted.  Motor Function - intact, moving foot and toes well on exam.  Cardiovascular- Regular rate and rhythm, no murmurs/rubs/gallops Respiratory- Lungs clear to auscultation bilaterally Gastrointestinal- soft, nontender and active bowel sounds   Assessment/Plan: 2 Days Post-Op Procedure(s) (LRB): COMPUTER ASSISTED TOTAL KNEE  ARTHROPLASTY (Left) Active Problems:   Total knee replacement status  Estimated body mass index is 36.48 kg/m as calculated from the following:   Height as of this encounter: 5\' 6"  (1.676 m).   Weight as of this encounter: 102.5 kg. Advance diet Up with therapy Discharge home with home health pending completion of BM and therapy goals.  Post-op dressing removed. Hemovac removed. Mini compression dressing applied.   DVT Prophylaxis - Lovenox, Ted hose and foot pumps Weight-Bearing as tolerated to left leg  Cassell Smiles, PA-C [facility] 07/29/2020, 8:26 AM

## 2020-08-01 ENCOUNTER — Encounter: Payer: Self-pay | Admitting: Orthopedic Surgery

## 2020-08-01 NOTE — Anesthesia Postprocedure Evaluation (Signed)
Anesthesia Post Note  Patient: Julia Cooper  Procedure(s) Performed: COMPUTER ASSISTED TOTAL KNEE ARTHROPLASTY (Left Knee)  Anesthesia Type: Spinal Comments: Pt discharged prior to being seen   No complications documented.   Last Vitals:  Vitals:   07/28/20 2320 07/29/20 0747  BP: 128/72 (!) 141/79  Pulse: 86 87  Resp: 16 16  Temp: 37.1 C 36.8 C  SpO2: 97% 95%    Last Pain:  Vitals:   07/29/20 1020  TempSrc:   PainSc: 2                  [name] K

## 2020-08-29 ENCOUNTER — Other Ambulatory Visit: Payer: Medicare Other

## 2020-08-29 ENCOUNTER — Other Ambulatory Visit: Payer: Self-pay

## 2020-08-29 DIAGNOSIS — Z20822 Contact with and (suspected) exposure to covid-19: Secondary | ICD-10-CM

## 2020-08-30 LAB — NOVEL CORONAVIRUS, NAA: SARS-CoV-2, NAA: NOT DETECTED

## 2020-08-30 LAB — SARS-COV-2, NAA 2 DAY TAT

## 2020-09-04 ENCOUNTER — Other Ambulatory Visit: Payer: Self-pay | Admitting: Family

## 2020-09-04 ENCOUNTER — Telehealth: Payer: Self-pay | Admitting: Family

## 2020-09-04 DIAGNOSIS — U071 COVID-19: Secondary | ICD-10-CM

## 2020-09-04 NOTE — Telephone Encounter (Signed)
I connected by phone with Julia Cooper on 09/04/2020 at 1:22 PM to discuss the potential use of a new treatment for mild to moderate COVID-19 viral infection in non-hospitalized patients.  This patient is a 69 y.o. female that meets the FDA criteria for Emergency Use Authorization of COVID monoclonal antibody casirivimab/imdevimab.  Has a (+) direct SARS-CoV-2 viral test result  Has mild or moderate COVID-19   Is NOT hospitalized due to COVID-19  Is within 10 days of symptom onset  Has at least one of the high risk factor(s) for progression to severe COVID-19 and/or hospitalization as defined in EUA.  Specific high risk criteria : BMI > 25, Diabetes and Immunosuppressive Disease or Treatment   I have spoken and communicated the following to the patient or parent/caregiver regarding COVID monoclonal antibody treatment:  1. FDA has authorized the emergency use for the treatment of mild to moderate COVID-19 in adults and pediatric patients with positive results of direct SARS-CoV-2 viral testing who are 64 years of age and older weighing at least 40 kg, and who are at high risk for progressing to severe COVID-19 and/or hospitalization.  2. The significant known and potential risks and benefits of COVID monoclonal antibody, and the extent to which such potential risks and benefits are unknown.  3. Information on available alternative treatments and the risks and benefits of those alternatives, including clinical trials.  4. Patients treated with COVID monoclonal antibody should continue to self-isolate and use infection control measures (e.g., wear mask, isolate, social distance, avoid sharing personal items, clean and disinfect high touch surfaces, and frequent handwashing) according to CDC guidelines.   5. The patient or parent/caregiver has the option to accept or refuse COVID monoclonal antibody treatment.  After reviewing this information with the patient, The patient agreed to  proceed with receiving casirivimab\imdevimab infusion and will be provided a copy of the Fact sheet prior to receiving the infusion. Loel Dubonnet 09/04/2020 1:22 PM

## 2020-09-05 ENCOUNTER — Other Ambulatory Visit: Payer: Medicare Other

## 2020-09-05 ENCOUNTER — Ambulatory Visit (HOSPITAL_COMMUNITY): Payer: Medicare Other

## 2021-07-27 ENCOUNTER — Other Ambulatory Visit: Payer: Self-pay | Admitting: Family Medicine

## 2021-07-27 DIAGNOSIS — Z1231 Encounter for screening mammogram for malignant neoplasm of breast: Secondary | ICD-10-CM

## 2021-08-09 ENCOUNTER — Other Ambulatory Visit: Payer: Self-pay

## 2021-08-09 ENCOUNTER — Ambulatory Visit
Admission: RE | Admit: 2021-08-09 | Discharge: 2021-08-09 | Disposition: A | Discharge status: Home / Self Care | Payer: Medicare Other | Source: Ambulatory Visit | Attending: Family Medicine | Admitting: Family Medicine

## 2021-08-09 DIAGNOSIS — Z1231 Encounter for screening mammogram for malignant neoplasm of breast: Secondary | ICD-10-CM | POA: Diagnosis not present

## 2021-10-23 ENCOUNTER — Encounter: Payer: Self-pay | Admitting: *Deleted

## 2021-10-24 ENCOUNTER — Other Ambulatory Visit: Payer: Self-pay

## 2021-10-24 ENCOUNTER — Ambulatory Visit: Payer: Medicare Other | Admitting: Anesthesiology

## 2021-10-24 ENCOUNTER — Encounter
Admission: RE | Disposition: A | Discharge status: Home / Self Care | Payer: Self-pay | Source: Ambulatory Visit | Attending: Gastroenterology

## 2021-10-24 ENCOUNTER — Encounter: Payer: Self-pay | Admitting: *Deleted

## 2021-10-24 ENCOUNTER — Ambulatory Visit
Admission: RE | Admit: 2021-10-24 | Discharge: 2021-10-24 | Disposition: A | Discharge status: Home / Self Care | Payer: Medicare Other | Source: Ambulatory Visit | Attending: Gastroenterology | Admitting: Gastroenterology

## 2021-10-24 DIAGNOSIS — F32A Depression, unspecified: Secondary | ICD-10-CM | POA: Diagnosis not present

## 2021-10-24 DIAGNOSIS — Z9049 Acquired absence of other specified parts of digestive tract: Secondary | ICD-10-CM | POA: Insufficient documentation

## 2021-10-24 DIAGNOSIS — F419 Anxiety disorder, unspecified: Secondary | ICD-10-CM | POA: Diagnosis not present

## 2021-10-24 DIAGNOSIS — Z8371 Family history of colonic polyps: Secondary | ICD-10-CM | POA: Diagnosis present

## 2021-10-24 DIAGNOSIS — I10 Essential (primary) hypertension: Secondary | ICD-10-CM | POA: Insufficient documentation

## 2021-10-24 DIAGNOSIS — Z7984 Long term (current) use of oral hypoglycemic drugs: Secondary | ICD-10-CM | POA: Diagnosis not present

## 2021-10-24 DIAGNOSIS — D125 Benign neoplasm of sigmoid colon: Secondary | ICD-10-CM | POA: Diagnosis not present

## 2021-10-24 DIAGNOSIS — Z85828 Personal history of other malignant neoplasm of skin: Secondary | ICD-10-CM | POA: Diagnosis not present

## 2021-10-24 DIAGNOSIS — D1779 Benign lipomatous neoplasm of other sites: Secondary | ICD-10-CM | POA: Diagnosis not present

## 2021-10-24 DIAGNOSIS — K640 First degree hemorrhoids: Secondary | ICD-10-CM | POA: Diagnosis not present

## 2021-10-24 DIAGNOSIS — E119 Type 2 diabetes mellitus without complications: Secondary | ICD-10-CM | POA: Insufficient documentation

## 2021-10-24 DIAGNOSIS — K219 Gastro-esophageal reflux disease without esophagitis: Secondary | ICD-10-CM | POA: Diagnosis not present

## 2021-10-24 DIAGNOSIS — D122 Benign neoplasm of ascending colon: Secondary | ICD-10-CM | POA: Diagnosis not present

## 2021-10-24 DIAGNOSIS — Z1211 Encounter for screening for malignant neoplasm of colon: Secondary | ICD-10-CM | POA: Insufficient documentation

## 2021-10-24 HISTORY — PX: COLONOSCOPY WITH PROPOFOL: SHX5780

## 2021-10-24 LAB — GLUCOSE, CAPILLARY: Glucose-Capillary: 140 mg/dL — ABNORMAL HIGH (ref 70–99)

## 2021-10-24 SURGERY — COLONOSCOPY WITH PROPOFOL
Anesthesia: General

## 2021-10-24 MED ORDER — PROPOFOL 500 MG/50ML IV EMUL
INTRAVENOUS | Status: AC
  Filled 2021-10-24: qty 650

## 2021-10-24 MED ORDER — PHENYLEPHRINE HCL (PRESSORS) 10 MG/ML IV SOLN
INTRAVENOUS | Status: AC
  Filled 2021-10-24: qty 1

## 2021-10-24 MED ORDER — LIDOCAINE HCL (CARDIAC) PF 100 MG/5ML IV SOSY
PREFILLED_SYRINGE | INTRAVENOUS | Status: DC | PRN
  Administered 2021-10-24: 100 mg via INTRAVENOUS

## 2021-10-24 MED ORDER — GLYCOPYRROLATE 0.2 MG/ML IJ SOLN
INTRAMUSCULAR | Status: DC | PRN
  Administered 2021-10-24: .2 mg via INTRAVENOUS

## 2021-10-24 MED ORDER — SODIUM CHLORIDE 0.9 % IV SOLN: INTRAVENOUS | Status: DC

## 2021-10-24 MED ORDER — PROPOFOL 10 MG/ML IV BOLUS
INTRAVENOUS | Status: DC | PRN
  Administered 2021-10-24: 50 mg via INTRAVENOUS
  Administered 2021-10-24: 10 mg via INTRAVENOUS

## 2021-10-24 MED ORDER — PROPOFOL 10 MG/ML IV BOLUS
INTRAVENOUS | Status: AC
  Filled 2021-10-24: qty 60

## 2021-10-24 MED ORDER — PROPOFOL 500 MG/50ML IV EMUL
INTRAVENOUS | Status: DC | PRN
  Administered 2021-10-24: 155 ug/kg/min via INTRAVENOUS

## 2021-10-24 MED ORDER — MIDAZOLAM HCL 2 MG/2ML IJ SOLN
INTRAMUSCULAR | Status: AC
  Filled 2021-10-24: qty 2

## 2021-10-24 NOTE — H&P (Signed)
Outpatient short stay form Pre-procedure 10/24/2021  Julia Rubenstein, MD  Primary Physician: Dion Body, MD  Reason for visit:  Screening colonoscopy  History of present illness:   70 y/o lady with history of hypertension, DM II, and depression here for screening colonoscopy. Sister with polyps. Last colonoscopy was 10 years ago. No blood thinners. History of cholecystectomy. No new symptoms.    Current Facility-Administered Medications:    0.9 %  sodium chloride infusion, , Intravenous, Continuous, Angeles Zehner, Hilton Cork, MD, Last Rate: 20 mL/hr at 10/24/21 0719, New Bag at 10/24/21 0719  Medications Prior to Admission  Medication Sig Dispense Refill Last Dose   acetaminophen (TYLENOL) 500 MG tablet Take 1,000 mg by mouth every 8 (eight) hours as needed for moderate pain.   Past Month   calcium carbonate (TUMS - DOSED IN MG ELEMENTAL CALCIUM) 500 MG chewable tablet Chew 1,000 mg by mouth daily as needed for indigestion or heartburn.    Past Week   celecoxib (CELEBREX) 200 MG capsule Take 1 capsule (200 mg total) by mouth 2 (two) times daily. 90 capsule 0 Past Month   Cholecalciferol (VITAMIN D3) 2000 units TABS Take 2,000 Units by mouth daily after lunch.    Past Week   escitalopram (LEXAPRO) 10 MG tablet Take 10 mg by mouth daily at 8 pm.    10/23/2021   HUMULIN N KWIKPEN 100 UNIT/ML Kiwkpen Inject 40 Units into the skin at bedtime.    10/23/2021   hydrALAZINE (APRESOLINE) 50 MG tablet Take 50 mg by mouth 2 (two) times a day.   10/24/2021   losartan-hydrochlorothiazide (HYZAAR) 100-25 MG tablet Take 1 tablet by mouth every morning.    10/24/2021   magnesium oxide (MAG-OX) 400 MG tablet Take 400 mg by mouth daily after lunch.    Past Week   metformin (FORTAMET) 1000 MG (OSM) 24 hr tablet Take 1,000 mg by mouth 2 (two) times daily with a meal.   3 Past Week   Multiple Vitamins-Minerals (MULTIVITAMIN WITH MINERALS) tablet Take 1 tablet by mouth daily.    Past Week   oxyCODONE (OXY  IR/ROXICODONE) 5 MG immediate release tablet Take 1 tablet (5 mg total) by mouth every 4 (four) hours as needed for moderate pain (pain score 4-6). 30 tablet 0 Past Month   OZEMPIC, 1 MG/DOSE, 4 MG/3ML SOPN Inject 1 mg into the skin every Monday.   Past Week   potassium chloride SA (K-DUR,KLOR-CON) 20 MEQ tablet Take 20 mEq by mouth at bedtime.    10/23/2021   traMADol (ULTRAM) 50 MG tablet Take 1 tablet (50 mg total) by mouth every 4 (four) hours as needed for moderate pain. 30 tablet 0 Past Month   enoxaparin (LOVENOX) 40 MG/0.4ML injection Inject 0.4 mLs (40 mg total) into the skin daily for 14 days. 5.6 mL 0      Allergies  Allergen Reactions   Ace Inhibitors Cough    Headache   Ciprofloxacin Other (See Comments)    Not sure. May have caused yeast infection   Doxazosin Cough   Other Other (See Comments)    Anesthesia-nausea   Amlodipine Other (See Comments)    Unknown     Past Medical History:  Diagnosis Date   Anxiety    Arthritis    Cancer ([place])    SKIN CANCER-MOHS   Diabetes mellitus without complication ([place])    Family history of adverse reaction to anesthesia    father  N/V   GERD (gastroesophageal reflux disease)  OCC   Hypertension    PONV (postoperative nausea and vomiting)    HARD TO WAKE UP   Tongue burning sensation     Review of systems:  Otherwise negative.    Physical Exam  Gen: Alert, oriented. Appears stated age.  HEENT: PERRLA. Lungs: No respiratory distress CV: RRR Abd: soft, benign, no masses Ext: No edema    Planned procedures: Proceed with colonoscopy. The patient understands the nature of the planned procedure, indications, risks, alternatives and potential complications including but not limited to bleeding, infection, perforation, damage to internal organs and possible oversedation/side effects from anesthesia. The patient agrees and gives consent to proceed.  Please refer to procedure notes for findings, recommendations and patient  disposition/instructions.     Julia Rubenstein, MD [facility]

## 2021-10-24 NOTE — Anesthesia Postprocedure Evaluation (Signed)
Anesthesia Post Note  Patient: Julia Cooper  Procedure(s) Performed: COLONOSCOPY WITH PROPOFOL  Patient location during evaluation: Endoscopy Anesthesia Type: General Level of consciousness: awake and alert Pain management: pain level controlled Vital Signs Assessment: post-procedure vital signs reviewed and stable Respiratory status: spontaneous breathing, nonlabored ventilation, respiratory function stable and patient connected to nasal cannula oxygen Cardiovascular status: blood pressure returned to baseline and stable Postop Assessment: no apparent nausea or vomiting Anesthetic complications: no   No notable events documented.   Last Vitals:  Vitals:   10/24/21 0820 10/24/21 0830  BP: 129/81 132/78  Pulse:    Resp:    Temp:    SpO2:      Last Pain:  Vitals:   10/24/21 0820  TempSrc:   PainSc: 0-No pain                 Arita Miss

## 2021-10-24 NOTE — Anesthesia Preprocedure Evaluation (Signed)
Anesthesia Evaluation  Patient identified by MRN, date of birth, ID band Patient awake    Reviewed: Allergy & Precautions, NPO status , Patient's Chart, lab work & pertinent test results, Unable to perform ROS - Chart review only  History of Anesthesia Complications (+) PONV and history of anesthetic complications  Airway Mallampati: III       Dental  (+) Teeth Intact   Pulmonary neg sleep apnea, neg COPD, Not current smoker,    breath sounds clear to auscultation       Cardiovascular hypertension, Pt. on medications (-) Past MI and (-) CHF (-) dysrhythmias (-) Valvular Problems/Murmurs Rhythm:Regular Rate:Normal - Systolic murmurs    Neuro/Psych neg Seizures PSYCHIATRIC DISORDERS Anxiety Depression    GI/Hepatic Neg liver ROS, GERD  Medicated and Controlled,  Endo/Other  diabetes, Type 2, Oral Hypoglycemic Agents  Renal/GU      Musculoskeletal   Abdominal   Peds  Hematology   Anesthesia Other Findings Past Medical History: No date: Anxiety No date: Arthritis No date: Cancer ([place])     Comment:  SKIN CANCER-MOHS No date: Diabetes mellitus without complication ([facility]) No date: Family history of adverse reaction to anesthesia     Comment:  father  N/V No date: GERD (gastroesophageal reflux disease)     Comment:  OCC No date: Hypertension No date: PONV (postoperative nausea and vomiting)     Comment:  HARD TO WAKE UP No date: Tongue burning sensation   Reproductive/Obstetrics                             Anesthesia Physical  Anesthesia Plan  ASA: 2  Anesthesia Plan: General   Post-op Pain Management:    Induction: Intravenous  PONV Risk Score and Plan: 4 or greater and Propofol infusion, TIVA and Ondansetron  Airway Management Planned: Nasal Cannula  Additional Equipment: None  Intra-op Plan:   Post-operative Plan:   Informed Consent: I have reviewed the patients History  and Physical, chart, labs and discussed the procedure including the risks, benefits and alternatives for the proposed anesthesia with the patient or authorized representative who has indicated his/her understanding and acceptance.     Dental advisory given  Plan Discussed with: CRNA and Surgeon  Anesthesia Plan Comments: (Discussed risks of anesthesia with patient, including possibility of difficulty with spontaneous ventilation under anesthesia necessitating airway intervention, PONV, and rare risks such as cardiac or respiratory or neurological events, and allergic reactions. Discussed the role of CRNA in patient's perioperative care. Patient understands.)        Anesthesia Quick Evaluation

## 2021-10-24 NOTE — Transfer of Care (Addendum)
Immediate Anesthesia Transfer of Care Note  Patient: Julia Cooper  Procedure(s) Performed: COLONOSCOPY WITH PROPOFOL  Patient Location: Endo  Anesthesia Type:General  Level of Consciousness: drowsy and patient cooperative  Airway & Oxygen Therapy: Patient Spontanous Breathing and Patient connected to face mask oxygen  Post-op Assessment: Report given to RN and Post -op Vital signs reviewed and stable  Post vital signs: Reviewed and stable  Last Vitals:  Vitals Value Taken Time  BP 136/82 10/24/21 0812  Temp 35.6 C 10/24/21 0810  Pulse 77 10/24/21 0813  Resp 16 10/24/21 0813  SpO2 98 % 10/24/21 0813  Vitals shown include unvalidated device data.  Last Pain:  Vitals:   10/24/21 0810  TempSrc: Temporal  PainSc: Asleep         Complications: No notable events documented.

## 2021-10-24 NOTE — Anesthesia Procedure Notes (Signed)
Procedure Name: General with mask airway Date/Time: 10/24/2021 7:54 AM Performed by: Kelton Pillar, CRNA Pre-anesthesia Checklist: Patient identified, Emergency Drugs available, Suction available and Patient being monitored Patient Re-evaluated:Patient Re-evaluated prior to induction Oxygen Delivery Method: Simple face mask Induction Type: IV induction Placement Confirmation: positive ETCO2 and CO2 detector Dental Injury: Teeth and Oropharynx as per pre-operative assessment

## 2021-10-24 NOTE — Op Note (Signed)
[facility] Patient Name: Julia Cooper Procedure Date: 10/24/2021 6:51 AM MRN: [id number] Account #: [id number] Date of Birth: 1951-08-04 Admit Type: Outpatient Age: 70 Room: [facility] ENDO ROOM 1 Gender: Female Note Status: Finalized Instrument Name: Jasper Riling [id number] Procedure:             Colonoscopy Indications:           Colon cancer screening in patient at increased risk:                         Family history of 1st-degree relative with colon polyps Providers:             Andrey Farmer MD, MD Referring MD:          Dion Body (Referring MD) Medicines:             Monitored Anesthesia Care Complications:         No immediate complications. Estimated blood loss:                         Minimal. Procedure:             Pre-Anesthesia Assessment:                        - Prior to the procedure, a History and Physical was                         performed, and patient medications and allergies were                         reviewed. The patient is competent. The risks and                         benefits of the procedure and the sedation options and                         risks were discussed with the patient. All questions                         were answered and informed consent was obtained.                         Patient identification and proposed procedure were                         verified by the physician, the nurse, the anesthetist                         and the technician in the endoscopy suite. Mental                         Status Examination: alert and oriented. Airway                         Examination: normal oropharyngeal airway and neck                         mobility. Respiratory Examination: clear to  auscultation. CV Examination: normal. Prophylactic                         Antibiotics: The patient does not require prophylactic                         antibiotics. Prior  Anticoagulants: The patient has                         taken no previous anticoagulant or antiplatelet                         agents. ASA Grade Assessment: III - A patient with                         severe systemic disease. After reviewing the risks and                         benefits, the patient was deemed in satisfactory                         condition to undergo the procedure. The anesthesia                         plan was to use monitored anesthesia care (MAC).                         Immediately prior to administration of medications,                         the patient was re-assessed for adequacy to receive                         sedatives. The heart rate, respiratory rate, oxygen                         saturations, blood pressure, adequacy of pulmonary                         ventilation, and response to care were monitored                         throughout the procedure. The physical status of the                         patient was re-assessed after the procedure.                        After obtaining informed consent, the colonoscope was                         passed under direct vision. Throughout the procedure,                         the patient's blood pressure, pulse, and oxygen                         saturations were monitored continuously. The  Colonoscope was introduced through the anus and                         advanced to the the cecum, identified by appendiceal                         orifice and ileocecal valve. The colonoscopy was                         performed without difficulty. The patient tolerated                         the procedure well. The quality of the bowel                         preparation was good. Findings:      The perianal and digital rectal examinations were normal.      A 2 mm polyp was found in the ascending colon. The polyp was sessile.       The polyp was removed with a jumbo cold forceps. Resection  and retrieval       were complete. Estimated blood loss was minimal.      There was a small lipoma, in the ascending colon.      A 4 mm polyp was found in the sigmoid colon. The polyp was sessile. The       polyp was removed with a cold snare. Resection and retrieval were       complete. Estimated blood loss was minimal.      Internal hemorrhoids were found during retroflexion. The hemorrhoids       were Grade I (internal hemorrhoids that do not prolapse).      The exam was otherwise without abnormality on direct and retroflexion       views. Impression:            - One 2 mm polyp in the ascending colon, removed with                         a jumbo cold forceps. Resected and retrieved.                        - Small lipoma in the ascending colon.                        - One 4 mm polyp in the sigmoid colon, removed with a                         cold snare. Resected and retrieved.                        - Internal hemorrhoids.                        - The examination was otherwise normal on direct and                         retroflexion views. Recommendation:        - Discharge patient to home.                        -  Resume previous diet.                        - Continue present medications.                        - Await pathology results.                        - Repeat colonoscopy for surveillance based on                         pathology results.                        - Return to referring physician as previously                         scheduled. Procedure Code(s):     --- Professional ---                        [phone], Colonoscopy, flexible; with removal of                         tumor(s), polyp(s), or other lesion(s) by snare                         technique                        [id number], 56, Colonoscopy, flexible; with biopsy, single                         or multiple Diagnosis Code(s):     --- Professional ---                        Z83.71, Family history of colonic  polyps                        K63.5, Polyp of colon                        D17.5, Benign lipomatous neoplasm of intra-abdominal                         organs                        K64.0, First degree hemorrhoids CPT copyright 2019 [employer] Association. All rights reserved. The codes documented in this report are preliminary and upon coder review may  be revised to meet current compliance requirements. Andrey Farmer MD, MD 10/24/2021 8:12:25 AM Number of Addenda: 0 Note Initiated On: 10/24/2021 6:51 AM Scope Withdrawal Time: 0 hours 13 minutes 45 seconds  Total Procedure Duration: 0 hours 19 minutes 39 seconds  Estimated Blood Loss:  Estimated blood loss was minimal.      [facility]

## 2021-10-24 NOTE — Interval H&P Note (Signed)
History and Physical Interval Note:  10/24/2021 7:41 AM  Julia Cooper  has presented today for surgery, with the diagnosis of Z83.71 Family History of Colon Polyps.  The various methods of treatment have been discussed with the patient and family. After consideration of risks, benefits and other options for treatment, the patient has consented to  Procedure(s) with comments: COLONOSCOPY WITH PROPOFOL (N/A) - DM as a surgical intervention.  The patient's history has been reviewed, patient examined, no change in status, stable for surgery.  I have reviewed the patient's chart and labs.  Questions were answered to the patient's satisfaction.     Lesly Rubenstein  Ok to proceed with colonoscopy

## 2021-10-25 ENCOUNTER — Encounter: Payer: Self-pay | Admitting: Gastroenterology

## 2021-10-25 LAB — SURGICAL PATHOLOGY

## 2022-03-05 ENCOUNTER — Other Ambulatory Visit: Payer: Self-pay | Admitting: Obstetrics and Gynecology

## 2022-03-12 ENCOUNTER — Other Ambulatory Visit: Payer: Self-pay

## 2022-03-12 ENCOUNTER — Encounter
Admission: RE | Admit: 2022-03-12 | Discharge: 2022-03-12 | Disposition: A | Discharge status: Home / Self Care | Payer: Medicare Other | Source: Ambulatory Visit | Attending: Obstetrics and Gynecology | Admitting: Obstetrics and Gynecology

## 2022-03-12 HISTORY — DX: Spinal stenosis, cervical region: M48.02

## 2022-03-12 HISTORY — DX: Other specified conditions associated with female genital organs and menstrual cycle: N94.89

## 2022-03-12 HISTORY — DX: Depression, unspecified: F32.A

## 2022-03-12 NOTE — H&P (Addendum)
Julia Cooper is a 71 y.o. female presenting with Pre Op Consulting  on 03/06/2022 ?  ?History of Present Illness: ?Patient returns today for preop exam prior to robotically assisted bilateral salpigo-ophorectomy for complex adnexal mass. Will also perform D&C hysteroscopy for fluid filled endometrium. ?  ?  ?Workup:  ?TVUS 01/2022 ?Uterus=6.91 x 3.69 x 4.82cm ?Uterus retroverted ?Fluid filled endometrium=5.40m; walls: 0.17 + 0.20cm=0.37cm (3.753m ?Rt solid ovarian cyst=1cm ?Lt ovary contains 4 cysts:1)solid=1.5cm    2)complex septated hemorrhagic=3.8cm; multiple septations=0.63cm; 0.13cm    3)complex septated with internal calcification=2.5cm; septation=0.19cm; calcification=1.24 x 0.64 x 0.98cm    4)complex septated=2.2cm; septations=0.20cm,0.15cm ?Free fluid seen in PCDS ?  ?Fibroids seen:1)posterior to endometrium=1.7cm     2)fundal=2.3cm ?  ?   ?Pertinent Hx: ?-- SVD x 2  ?- Hx of left breast excisional biopsy 2017 and 2019 ?            -2017: SCLEROSING INTRADUCTAL PAPILLOMA, 1.5 CM WITH MICROCALCIFICATIONS AND  ?FOCAL ATYPICAL DUCTAL HYPERPLASIA. ?  ?-HTN ?-T2DM ?  ?  ?Past Medical History:  has a past medical history of Cervical spinal stenosis, COVID-19 (08/2020), Depression with anxiety, Diabetes mellitus type 2, uncomplicated (CMS-HCC), Hypertension, and Obesity.  ?Past Surgical History:  has a past surgical history that includes Cholecystectomy; Left breast biopsy in February 1993, benign; Cervical spine surgery, 2002; Colonoscopy (04/24/2011); toe nail removal (Left); Breast excisional biopsy (Left, 07/10/2016); BREAST LUMPECTOMY WITH NEEDLE LOCALIZATION (Left, 07/20/2016); excisional breast bx (Left, 10/22/2018); Left knee arthroscopy, partial medial meniscectomy, and chondroplasty (06/10/2019); knee surgery;  Left total knee arthroplasty using computer-assisted navigation (07/27/2020); and Colonoscopy (10/24/2021). ?Family History: family history includes Anuerysm in her paternal grandfather;  Diabetes in her paternal grandmother; Diabetes type II in her father; High blood pressure (Hypertension) in her father, maternal grandfather, maternal grandmother, mother, paternal grandfather, sister, and son; No Known Problems in her son; Stomach cancer in her mother; Stroke in her maternal grandfather. ?Social History:  reports that she has never smoked. She has never used smokeless tobacco. She reports that she does not drink alcohol and does not use drugs. ?OB/GYN History:  ?OB History  ?  ?   ?Gravida ?4 ?  ?Para ?2 ?  ?Term ?2 ?  ?Preterm ?  ?  ?AB ?2 ?  ?Living ?2 ? ?  ?   ?SAB ?2 ?  ?IAB ?  ?  ?Ectopic ?  ?  ?Molar ?  ?  ?Multiple ?  ?  ?Live Births ?2 ? ?  ?  ?  ?Allergies: is allergic to ace inhibitors, amlodipine, ciprofloxacin, doxazosin, and other. ?Medications: ?  ?Current Outpatient Medications:  ?  acetaminophen (TYLENOL) 500 MG tablet, Take 1,000 mg by mouth every 8 (eight) hours as needed   , Disp: , Rfl:  ?  BD NANO 2ND GEN PEN NEEDLE 32 gauge x 5/32" Ndle, USE TWICE DAILY AS DIRECTED, Disp: 200 each, Rfl: 1 ?  blood glucose diagnostic test strip, Please use Bayer contour strips; E11.42, Disp: 100 each, Rfl: 12 ?  blood glucose meter kit, Use to test blood glucose.   Please use Bayer contour meter; E11.42 (Patient taking differently: Use to test blood glucose.   Please use Bayer contour meter; E11.42), Disp: 1 each, Rfl: 0 ?  cholecalciferol (VITAMIN D3) 2,000 unit tablet, Take 2,000 Units by mouth once daily., Disp: , Rfl:  ?  cyanocobalamin (VITAMIN B12) 1000 MCG tablet, Take 1,000 mcg by mouth once daily, Disp: , Rfl:  ?  escitalopram oxalate (  LEXAPRO) 10 MG tablet, TAKE 1 TABLET BY MOUTH EVERY DAY, Disp: 90 tablet, Rfl: 1 ?  hydrALAZINE (APRESOLINE) 50 MG tablet, TAKE 1 TABLET(50 MG) BY MOUTH TWICE DAILY, Disp: 180 tablet, Rfl: 1 ?  insulin NPH human isophane (HUMULIN N NPH INSULIN KWIKPEN SUBQ), Inject 40 Units subcutaneously nightly, Disp: , Rfl:  ?  ipratropium (ATROVENT) 0.06 % nasal  spray, Place 2 sprays into both nostrils 3 (three) times daily as needed for Rhinitis, Disp: 15 mL, Rfl: 0 ?  losartan-hydrochlorothiazide (HYZAAR) 100-25 mg tablet, TAKE 1 TABLET BY MOUTH EVERY DAY, Disp: 90 tablet, Rfl: 1 ?  magnesium 200 mg, Take 400 mg by mouth once daily, Disp: , Rfl:  ?  metFORMIN (FORTAMET) 1000 MG (OSM) 24 hr tablet, Take 1,000 mg by mouth 2 (two) times daily with meals, Disp: , Rfl:  ?  minoxidiL 2.5 MG tablet, Take 0.5 tablets (1.25 mg total) by mouth once daily, Disp: , Rfl:  ?  multivitamin tablet, Take 1 tablet by mouth once daily., Disp: , Rfl:  ?  potassium chloride (KLOR-CON) 20 MEQ ER tablet, TAKE 1 TABLET(20 MEQ) BY MOUTH EVERY DAY, Disp: 90 tablet, Rfl: 1 ?  promethazine-dextromethorphan (PROMETHAZINE-DM) 6.25-15 mg/5 mL syrup, Take 5 mLs by mouth every 6 (six) hours as needed, Disp: 120 mL, Rfl: 0 ?  semaglutide 2 mg/dose (8 mg/3 mL) PnIj, Inject 2 mg subcutaneously once a week, Disp: 3 mL, Rfl: 12 ?  ?Review of Systems: ?No SOB, no palpitations or chest pain, no new lower extremity edema, no nausea or vomiting or bowel or bladder complaints. See HPI for gyn specific ROS. ?  ? Exam: ?  ?BP 120/80   Ht 167.6 cm (_0 )   Wt 100.7 kg (222 lb)   LMP  (LMP Unknown)   BMI 35.83 kg/m?  ?  ?General: Patient is well-groomed, well-nourished, appears stated age in no acute distress ?  ?HEENT: head is atraumatic and normocephalic, trachea is midline, neck is supple with no palpable nodules ?  ?CV: Regular rhythm and normal heart rate, no murmur ?  ?Pulm: Clear to auscultation throughout lung fields with no wheezing, crackles, or rhonchi. No increased work of breathing ?  ?Abdomen: soft , no mass, non-tender, no rebound tenderness, no hepatomegaly ?  ?Pelvic: deferred  ?  ?Impression: ?  ?The primary encounter diagnosis was Preop examination. A diagnosis of Complex ovarian cyst was also pertinent to this visit. ?  ?Plan: ?  ?1. Preoperative Exam ?-Patient returns for a preoperative  discussion regarding her plans to proceed with surgical treatment of her Complex Adnexal Mass by XI RA BSO with pelvic washings procedure.   ? ?D&C hysteroscopy added for fluid filled endometrium in setting of breast cancer, postmenopausal with a thickened endometrial stripe. ? ?The patient and I discussed the technical aspects of the procedure including the potential for risks and complications.  These include but are not limited to the risk of infection requiring post-operative antibiotics or further procedures.  We talked about the risk of injury to adjacent organs including bladder, bowel, ureter, blood vessels or nerves.  We talked about the need to convert to an open incision.  We talked about the possible need for blood transfusion.  We talked about postop complications such as thromboembolic or cardiopulmonary complications.  All of her questions were answered.  Her preoperative exam was completed and the appropriate consents were signed. She is scheduled to undergo this procedure in the near future. ?  ?Specific Peri-operative Considerations:  ?-  Consent: obtained today ?- Health Maintenance:  ?- Labs: CBC, CMP preoperatively ?- Studies: EKG, CXR preoperatively ?- Bowel Preparation: None required ?- Abx:  Cefoxitin 2g ?- VTE ppx: SCDs perioperatively ?- Glucose Protocol:  ?- Beta-blockade:  ?  ?  ?Return for Postop check. ?  ?

## 2022-03-12 NOTE — Patient Instructions (Addendum)
Your procedure is scheduled on: Monday, April 3 ?Report to the Registration Desk on the 1st floor of the [facility]. ?To find out your arrival time, please call [phone] between 1PM - 3PM on: Friday, March 31 ? ?REMEMBER: ?Instructions that are not followed completely may result in serious medical risk, up to and including death; or upon the discretion of your surgeon and anesthesiologist your surgery may need to be rescheduled. ? ?Do not eat food after midnight the night before surgery.  ?No gum chewing, lozengers or hard candies. ? ?You may however, drink water up to 2 hours before you are scheduled to arrive for your surgery. Do not drink anything within 2 hours of your scheduled arrival time. ? ?TAKE THESE MEDICATIONS THE MORNING OF SURGERY WITH A SIP OF WATER: ? ?Hydralazine ? ?Stop Metformin 2 days prior to surgery. The last day to take metformin is Friday, March 31. Resume AFTER surgery.  ?Only take 1/2 of your insulin dose (20 units) the night before surgery. Do NOT take any insulin on the morning of surgery. ? ?One week prior to surgery: starting March 27 ?Stop Anti-inflammatories (NSAIDS) such as Advil, Aleve, Ibuprofen, Motrin, Naproxen, Naprosyn and Aspirin based products such as Excedrin, Goodys Powder, BC Powder. ?Stop ANY OVER THE COUNTER supplements until after surgery. Stop multiple vitamins. ?You may however, continue to take Tylenol if needed for pain up until the day of surgery. ? ?No Alcohol for 24 hours before or after surgery. ? ?No Smoking including e-cigarettes for 24 hours prior to surgery.  ?No chewable tobacco products for at least 6 hours prior to surgery.  ?No nicotine patches on the day of surgery. ? ?Do not use any "recreational" drugs for at least a week prior to your surgery.  ?Please be advised that the combination of cocaine and anesthesia may have negative outcomes, up to and including death. ?If you test positive for cocaine, your surgery will be cancelled. ? ?On the  morning of surgery brush your teeth with toothpaste and water, you may rinse your mouth with mouthwash if you wish. ?Do not swallow any toothpaste or mouthwash. ? ?Use CHG Soap as directed on instruction sheet. ? ?Do not wear jewelry, make-up, hairpins, clips or nail polish. ? ?Do not wear lotions, powders, or perfumes.  ? ?Do not shave body from the neck down 48 hours prior to surgery just in case you cut yourself which could leave a site for infection.  ?Also, freshly shaved skin may become irritated if using the CHG soap. ? ?Contact lenses, hearing aids and dentures may not be worn into surgery. ? ?Do not bring valuables to the hospital. [facility] is not responsible for any missing/lost belongings or valuables.  ? ?Notify your doctor if there is any change in your medical condition (cold, fever, infection). ? ?Wear comfortable clothing (specific to your surgery type) to the hospital. ? ?After surgery, you can help prevent lung complications by doing breathing exercises.  ?Take deep breaths and cough every 1-2 hours. Your doctor may order a device called an Incentive Spirometer to help you take deep breaths. ?When coughing or sneezing, hold a pillow firmly against your incision with both hands. This is called ?splinting.? Doing this helps protect your incision. It also decreases belly discomfort. ? ?If you are being discharged the day of surgery, you will not be allowed to drive home. ?You will need a responsible adult (18 years or older) to drive you home and stay with you that night.  ? ?  If you are taking public transportation, you will need to have a responsible adult (18 years or older) with you. ?Please confirm with your physician that it is acceptable to use public transportation.  ? ?Please call the [place] Dept. at [phone] if you have any questions about these instructions. ? ?Surgery Visitation Policy: ? ?Patients undergoing a surgery or procedure may have two family members or  support persons with them as long as the person is not COVID-19 positive or experiencing its symptoms.  ?

## 2022-03-14 ENCOUNTER — Encounter
Admission: RE | Admit: 2022-03-14 | Discharge: 2022-03-14 | Disposition: A | Discharge status: Home / Self Care | Payer: Medicare Other | Source: Ambulatory Visit | Attending: Obstetrics and Gynecology | Admitting: Obstetrics and Gynecology

## 2022-03-14 DIAGNOSIS — Z01818 Encounter for other preprocedural examination: Secondary | ICD-10-CM | POA: Diagnosis present

## 2022-03-14 DIAGNOSIS — E1159 Type 2 diabetes mellitus with other circulatory complications: Secondary | ICD-10-CM | POA: Insufficient documentation

## 2022-03-14 DIAGNOSIS — Z01812 Encounter for preprocedural laboratory examination: Secondary | ICD-10-CM

## 2022-03-14 DIAGNOSIS — E1142 Type 2 diabetes mellitus with diabetic polyneuropathy: Secondary | ICD-10-CM | POA: Diagnosis not present

## 2022-03-14 DIAGNOSIS — Z794 Long term (current) use of insulin: Secondary | ICD-10-CM | POA: Insufficient documentation

## 2022-03-14 DIAGNOSIS — I10 Essential (primary) hypertension: Secondary | ICD-10-CM | POA: Diagnosis not present

## 2022-03-14 LAB — BASIC METABOLIC PANEL
Anion gap: 9 (ref 5–15)
BUN: 11 mg/dL (ref 8–23)
CO2: 27 mmol/L (ref 22–32)
Calcium: 8.7 mg/dL — ABNORMAL LOW (ref 8.9–10.3)
Chloride: 95 mmol/L — ABNORMAL LOW (ref 98–111)
Creatinine, Ser: 0.59 mg/dL (ref 0.44–1.00)
GFR, Estimated: >60 mL/min (ref >60)
Glucose, Bld: 187 mg/dL — ABNORMAL HIGH (ref 70–99)
Potassium: 3.6 mmol/L (ref 3.5–5.1)
Sodium: 131 mmol/L — ABNORMAL LOW (ref 135–145)

## 2022-03-14 LAB — TYPE AND SCREEN
ABO/RH(D): O POS
Antibody Screen: NEGATIVE

## 2022-03-14 LAB — CBC
HCT: 37.1 % (ref 36.0–46.0)
Hemoglobin: 12.4 g/dL (ref 12.0–15.0)
MCH: 29.6 pg (ref 26.0–34.0)
MCHC: 33.4 g/dL (ref 30.0–36.0)
MCV: 88.5 fL (ref 80.0–100.0)
Platelets: 295 10*3/uL (ref 150–400)
RBC: 4.19 MIL/uL (ref 3.87–5.11)
RDW: 13.8 % (ref 11.5–15.5)
WBC: 5.6 10*3/uL (ref 4.0–10.5)
nRBC: 0 % (ref 0.0–0.2)

## 2022-03-18 MED ORDER — POVIDONE-IODINE 10 % EX SWAB
2.0000 "application " | Freq: Once | CUTANEOUS | Status: AC
  Administered 2022-03-19: 2 via TOPICAL

## 2022-03-18 MED ORDER — CEFAZOLIN SODIUM-DEXTROSE 2-4 GM/100ML-% IV SOLN
2.0000 g | INTRAVENOUS | Status: AC
  Administered 2022-03-19: 2 g via INTRAVENOUS

## 2022-03-18 MED ORDER — SODIUM CHLORIDE 0.9 % IV SOLN: INTRAVENOUS | Status: DC

## 2022-03-18 MED ORDER — FAMOTIDINE 20 MG PO TABS
20.0000 mg | ORAL_TABLET | Freq: Once | ORAL | Status: AC
Start: 2022-03-18 — End: 2022-03-19

## 2022-03-18 MED ORDER — APREPITANT 40 MG PO CAPS: 40.0000 mg | ORAL_CAPSULE | Freq: Once | ORAL | Status: AC

## 2022-03-18 MED ORDER — ORAL CARE MOUTH RINSE: 15.0000 mL | Freq: Once | OROMUCOSAL | Status: AC

## 2022-03-18 MED ORDER — LACTATED RINGERS IV SOLN: INTRAVENOUS | Status: DC

## 2022-03-18 MED ORDER — CHLORHEXIDINE GLUCONATE 0.12 % MT SOLN: 15.0000 mL | Freq: Once | OROMUCOSAL | Status: AC

## 2022-03-19 ENCOUNTER — Encounter: Payer: Self-pay | Admitting: Obstetrics and Gynecology

## 2022-03-19 ENCOUNTER — Other Ambulatory Visit: Payer: Self-pay

## 2022-03-19 ENCOUNTER — Ambulatory Visit: Payer: Medicare Other | Admitting: Anesthesiology

## 2022-03-19 ENCOUNTER — Encounter
Admission: RE | Disposition: A | Discharge status: Home / Self Care | Payer: Self-pay | Source: Home / Self Care | Attending: Obstetrics and Gynecology

## 2022-03-19 ENCOUNTER — Ambulatory Visit
Admission: RE | Admit: 2022-03-19 | Discharge: 2022-03-19 | Disposition: A | Discharge status: Home / Self Care | Payer: Medicare Other | Attending: Obstetrics and Gynecology | Admitting: Obstetrics and Gynecology

## 2022-03-19 DIAGNOSIS — N959 Unspecified menopausal and perimenopausal disorder: Secondary | ICD-10-CM | POA: Diagnosis not present

## 2022-03-19 DIAGNOSIS — E1159 Type 2 diabetes mellitus with other circulatory complications: Secondary | ICD-10-CM

## 2022-03-19 DIAGNOSIS — N838 Other noninflammatory disorders of ovary, fallopian tube and broad ligament: Secondary | ICD-10-CM | POA: Insufficient documentation

## 2022-03-19 DIAGNOSIS — Z01812 Encounter for preprocedural laboratory examination: Secondary | ICD-10-CM

## 2022-03-19 DIAGNOSIS — F418 Other specified anxiety disorders: Secondary | ICD-10-CM | POA: Diagnosis not present

## 2022-03-19 DIAGNOSIS — D271 Benign neoplasm of left ovary: Secondary | ICD-10-CM | POA: Insufficient documentation

## 2022-03-19 DIAGNOSIS — K219 Gastro-esophageal reflux disease without esophagitis: Secondary | ICD-10-CM | POA: Diagnosis not present

## 2022-03-19 DIAGNOSIS — I10 Essential (primary) hypertension: Secondary | ICD-10-CM | POA: Diagnosis not present

## 2022-03-19 DIAGNOSIS — E1142 Type 2 diabetes mellitus with diabetic polyneuropathy: Secondary | ICD-10-CM

## 2022-03-19 DIAGNOSIS — D259 Leiomyoma of uterus, unspecified: Secondary | ICD-10-CM | POA: Diagnosis present

## 2022-03-19 DIAGNOSIS — N83201 Unspecified ovarian cyst, right side: Secondary | ICD-10-CM

## 2022-03-19 DIAGNOSIS — C50919 Malignant neoplasm of unspecified site of unspecified female breast: Secondary | ICD-10-CM | POA: Insufficient documentation

## 2022-03-19 DIAGNOSIS — E119 Type 2 diabetes mellitus without complications: Secondary | ICD-10-CM | POA: Insufficient documentation

## 2022-03-19 DIAGNOSIS — C569 Malignant neoplasm of unspecified ovary: Secondary | ICD-10-CM

## 2022-03-19 DIAGNOSIS — Z794 Long term (current) use of insulin: Secondary | ICD-10-CM | POA: Diagnosis not present

## 2022-03-19 HISTORY — PX: HYSTEROSCOPY WITH D & C: SHX1775

## 2022-03-19 HISTORY — PX: ROBOTIC ASSISTED BILATERAL SALPINGO OOPHERECTOMY: SHX6078

## 2022-03-19 LAB — GLUCOSE, CAPILLARY
Glucose-Capillary: 177 mg/dL — ABNORMAL HIGH (ref 70–99)
Glucose-Capillary: 268 mg/dL — ABNORMAL HIGH (ref 70–99)

## 2022-03-19 SURGERY — SALPINGO-OOPHORECTOMY, BILATERAL, ROBOT-ASSISTED
Anesthesia: General | Site: Uterus

## 2022-03-19 MED ORDER — PROPOFOL 500 MG/50ML IV EMUL
INTRAVENOUS | Status: DC | PRN
  Administered 2022-03-19: 175 ug/kg/min via INTRAVENOUS

## 2022-03-19 MED ORDER — OXYCODONE HCL 5 MG PO TABS
ORAL_TABLET | ORAL | Status: DC
Start: 2022-03-19 — End: 2022-03-19
  Filled 2022-03-19: qty 1

## 2022-03-19 MED ORDER — BUPIVACAINE HCL (PF) 0.5 % IJ SOLN
INTRAMUSCULAR | Status: DC | PRN
  Administered 2022-03-19: 20 mL

## 2022-03-19 MED ORDER — APREPITANT 40 MG PO CAPS
ORAL_CAPSULE | ORAL | Status: AC
Start: 2022-03-19 — End: 2022-03-19
  Administered 2022-03-19: 40 mg via ORAL
  Filled 2022-03-19: qty 1

## 2022-03-19 MED ORDER — DOCUSATE SODIUM 100 MG PO CAPS: 100.0000 mg | ORAL_CAPSULE | Freq: Two times a day (BID) | ORAL | 0 refills | Status: AC

## 2022-03-19 MED ORDER — SUGAMMADEX SODIUM 500 MG/5ML IV SOLN
INTRAVENOUS | Status: DC | PRN
  Administered 2022-03-19: 400 mg via INTRAVENOUS

## 2022-03-19 MED ORDER — ROCURONIUM BROMIDE 100 MG/10ML IV SOLN
INTRAVENOUS | Status: DC | PRN
  Administered 2022-03-19: 30 mg via INTRAVENOUS
  Administered 2022-03-19: 60 mg via INTRAVENOUS
  Administered 2022-03-19: 10 mg via INTRAVENOUS

## 2022-03-19 MED ORDER — DEXAMETHASONE SODIUM PHOSPHATE 10 MG/ML IJ SOLN
INTRAMUSCULAR | Status: DC | PRN
  Administered 2022-03-19: 5 mg via INTRAVENOUS

## 2022-03-19 MED ORDER — IBUPROFEN 800 MG PO TABS: 800.0000 mg | ORAL_TABLET | Freq: Three times a day (TID) | ORAL | 1 refills | Status: AC

## 2022-03-19 MED ORDER — PROPOFOL 10 MG/ML IV BOLUS
INTRAVENOUS | Status: AC
  Filled 2022-03-19: qty 20

## 2022-03-19 MED ORDER — FAMOTIDINE 20 MG PO TABS
ORAL_TABLET | ORAL | Status: AC
Start: 2022-03-19 — End: 2022-03-19
  Administered 2022-03-19: 20 mg via ORAL
  Filled 2022-03-19: qty 1

## 2022-03-19 MED ORDER — ACETAMINOPHEN 500 MG PO TABS: 1000.0000 mg | ORAL_TABLET | Freq: Four times a day (QID) | ORAL | 0 refills | Status: AC

## 2022-03-19 MED ORDER — OXYCODONE HCL 5 MG PO TABS: 5.0000 mg | ORAL_TABLET | ORAL | 0 refills | Status: AC | PRN

## 2022-03-19 MED ORDER — MIDAZOLAM HCL 2 MG/2ML IJ SOLN
INTRAMUSCULAR | Status: DC | PRN
  Administered 2022-03-19: 1 mg via INTRAVENOUS

## 2022-03-19 MED ORDER — 0.9 % SODIUM CHLORIDE (POUR BTL) OPTIME
TOPICAL | Status: DC | PRN
  Administered 2022-03-19: 500 mL

## 2022-03-19 MED ORDER — OXYCODONE HCL 5 MG/5ML PO SOLN: 5.0000 mg | Freq: Once | ORAL | Status: AC | PRN

## 2022-03-19 MED ORDER — SILVER NITRATE-POT NITRATE 75-25 % EX MISC
CUTANEOUS | Status: AC
  Filled 2022-03-19: qty 10

## 2022-03-19 MED ORDER — ROCURONIUM BROMIDE 10 MG/ML (PF) SYRINGE
PREFILLED_SYRINGE | INTRAVENOUS | Status: AC
  Filled 2022-03-19: qty 10

## 2022-03-19 MED ORDER — ACETAMINOPHEN 10 MG/ML IV SOLN: 1000.0000 mg | Freq: Once | INTRAVENOUS | Status: DC | PRN

## 2022-03-19 MED ORDER — PROPOFOL 500 MG/50ML IV EMUL
INTRAVENOUS | Status: AC
  Filled 2022-03-19: qty 50

## 2022-03-19 MED ORDER — DEXAMETHASONE SODIUM PHOSPHATE 10 MG/ML IJ SOLN
INTRAMUSCULAR | Status: AC
  Filled 2022-03-19: qty 1

## 2022-03-19 MED ORDER — PROPOFOL 10 MG/ML IV BOLUS
INTRAVENOUS | Status: DC | PRN
  Administered 2022-03-19: 150 mg via INTRAVENOUS
  Administered 2022-03-19: 30 mg via INTRAVENOUS

## 2022-03-19 MED ORDER — LIDOCAINE HCL (CARDIAC) PF 100 MG/5ML IV SOSY
PREFILLED_SYRINGE | INTRAVENOUS | Status: DC | PRN
Start: 2022-03-19 — End: 2022-03-19
  Administered 2022-03-19: 100 mg via INTRAVENOUS

## 2022-03-19 MED ORDER — INSULIN ASPART 100 UNIT/ML IJ SOLN
INTRAMUSCULAR | Status: AC
  Filled 2022-03-19: qty 1

## 2022-03-19 MED ORDER — LIDOCAINE HCL (PF) 2 % IJ SOLN
INTRAMUSCULAR | Status: AC
  Filled 2022-03-19: qty 5

## 2022-03-19 MED ORDER — FENTANYL CITRATE (PF) 100 MCG/2ML IJ SOLN
25.0000 ug | INTRAMUSCULAR | Status: DC | PRN
  Administered 2022-03-19 (×2): 25 ug via INTRAVENOUS

## 2022-03-19 MED ORDER — ONDANSETRON HCL 4 MG/2ML IJ SOLN: 4.0000 mg | Freq: Once | INTRAMUSCULAR | Status: DC | PRN

## 2022-03-19 MED ORDER — KETOROLAC TROMETHAMINE 30 MG/ML IJ SOLN
INTRAMUSCULAR | Status: DC | PRN
  Administered 2022-03-19: 15 mg via INTRAVENOUS

## 2022-03-19 MED ORDER — CEFAZOLIN SODIUM-DEXTROSE 2-4 GM/100ML-% IV SOLN
INTRAVENOUS | Status: AC
  Filled 2022-03-19: qty 100

## 2022-03-19 MED ORDER — OXYCODONE HCL 5 MG PO TABS
5.0000 mg | ORAL_TABLET | Freq: Once | ORAL | Status: AC | PRN
  Administered 2022-03-19: 5 mg via ORAL

## 2022-03-19 MED ORDER — CHLORHEXIDINE GLUCONATE 0.12 % MT SOLN
OROMUCOSAL | Status: AC
  Administered 2022-03-19: 15 mL via OROMUCOSAL
  Filled 2022-03-19: qty 15

## 2022-03-19 MED ORDER — DEXMEDETOMIDINE (PRECEDEX) IN NS 20 MCG/5ML (4 MCG/ML) IV SYRINGE
PREFILLED_SYRINGE | INTRAVENOUS | Status: DC | PRN
  Administered 2022-03-19: 6 ug via INTRAVENOUS
  Administered 2022-03-19: 8 ug via INTRAVENOUS
  Administered 2022-03-19: 6 ug via INTRAVENOUS

## 2022-03-19 MED ORDER — ONDANSETRON HCL 4 MG/2ML IJ SOLN
INTRAMUSCULAR | Status: AC
  Filled 2022-03-19: qty 2

## 2022-03-19 MED ORDER — BUPIVACAINE HCL (PF) 0.5 % IJ SOLN
INTRAMUSCULAR | Status: AC
  Filled 2022-03-19: qty 30

## 2022-03-19 MED ORDER — FENTANYL CITRATE (PF) 100 MCG/2ML IJ SOLN
INTRAMUSCULAR | Status: DC | PRN
Start: 2022-03-19 — End: 2022-03-19
  Administered 2022-03-19: 100 ug via INTRAVENOUS

## 2022-03-19 MED ORDER — ACETAMINOPHEN 10 MG/ML IV SOLN
INTRAVENOUS | Status: DC | PRN
Start: 2022-03-19 — End: 2022-03-19
  Administered 2022-03-19: 1000 mg via INTRAVENOUS

## 2022-03-19 MED ORDER — PROPOFOL 1000 MG/100ML IV EMUL
INTRAVENOUS | Status: AC
  Filled 2022-03-19: qty 200

## 2022-03-19 MED ORDER — ONDANSETRON HCL 4 MG/2ML IJ SOLN
INTRAMUSCULAR | Status: DC | PRN
  Administered 2022-03-19: 4 mg via INTRAVENOUS

## 2022-03-19 MED ORDER — CALCIUM CHLORIDE 10 % IV SOLN
INTRAVENOUS | Status: AC
  Filled 2022-03-19: qty 10

## 2022-03-19 MED ORDER — PROPOFOL 500 MG/50ML IV EMUL
INTRAVENOUS | Status: AC
  Filled 2022-03-19: qty 250

## 2022-03-19 MED ORDER — FENTANYL CITRATE (PF) 100 MCG/2ML IJ SOLN
INTRAMUSCULAR | Status: AC
  Filled 2022-03-19: qty 2

## 2022-03-19 MED ORDER — MIDAZOLAM HCL 2 MG/2ML IJ SOLN
INTRAMUSCULAR | Status: AC
  Filled 2022-03-19: qty 2

## 2022-03-19 MED ORDER — INSULIN ASPART 100 UNIT/ML IJ SOLN
6.0000 [IU] | Freq: Once | INTRAMUSCULAR | Status: AC
  Administered 2022-03-19: 6 [IU] via SUBCUTANEOUS

## 2022-03-19 MED ORDER — ACETAMINOPHEN 10 MG/ML IV SOLN
INTRAVENOUS | Status: AC
  Filled 2022-03-19: qty 100

## 2022-03-19 SURGICAL SUPPLY — 84 items
ADH SKN CLS APL DERMABOND .7 (GAUZE/BANDAGES/DRESSINGS) ×2
APL PRP STRL LF DISP 70% ISPRP (MISCELLANEOUS) ×2
APL SRG 38 LTWT LNG FL B (MISCELLANEOUS)
APPLICATOR ARISTA FLEXITIP XL (MISCELLANEOUS) IMPLANT
BACTOSHIELD CHG 4% 4OZ (MISCELLANEOUS) ×1
BAG DRN RND TRDRP ANRFLXCHMBR (UROLOGICAL SUPPLIES) ×2
BAG INFUSER PRESSURE 100CC (MISCELLANEOUS) ×3 IMPLANT
BAG RETRIEVAL 10 (BASKET) ×1
BAG URINE DRAIN 2000ML AR STRL (UROLOGICAL SUPPLIES) ×3 IMPLANT
BLADE SURG SZ11 CARB STEEL (BLADE) ×3 IMPLANT
CANNULA CAP OBTURATR AIRSEAL 8 (CAP) ×3 IMPLANT
CANNULA REDUC XI 12-8 STAPL (CANNULA) ×1
CANNULA REDUCER 12-8 DVNC XI (CANNULA) IMPLANT
CATH FOLEY 2WAY  5CC 16FR (CATHETERS) ×1
CATH FOLEY 2WAY 5CC 16FR (CATHETERS) ×2
CATH URTH 16FR FL 2W BLN LF (CATHETERS) ×2 IMPLANT
CHLORAPREP W/TINT 26 (MISCELLANEOUS) ×3 IMPLANT
COVER TIP SHEARS 8 DVNC (MISCELLANEOUS) ×2 IMPLANT
COVER TIP SHEARS 8MM DA VINCI (MISCELLANEOUS) ×1
DERMABOND ADVANCED (GAUZE/BANDAGES/DRESSINGS) ×1
DERMABOND ADVANCED .7 DNX12 (GAUZE/BANDAGES/DRESSINGS) ×2 IMPLANT
DRAPE 3/4 80X56 (DRAPES) ×6 IMPLANT
DRAPE ARM DVNC X/XI (DISPOSABLE) ×8 IMPLANT
DRAPE COLUMN DVNC XI (DISPOSABLE) ×2 IMPLANT
DRAPE DA VINCI XI ARM (DISPOSABLE) ×4
DRAPE DA VINCI XI COLUMN (DISPOSABLE) ×1
DRSG TELFA 3X8 NADH (GAUZE/BANDAGES/DRESSINGS) ×3 IMPLANT
ELECT REM PT RETURN 9FT ADLT (ELECTROSURGICAL) ×3
ELECTRODE REM PT RTRN 9FT ADLT (ELECTROSURGICAL) ×2 IMPLANT
GAUZE 4X4 16PLY ~~LOC~~+RFID DBL (SPONGE) ×6 IMPLANT
GLOVE SURG ENC MOIS LTX SZ7 (GLOVE) ×12 IMPLANT
GLOVE SURG SYN 7.0 (GLOVE) ×3 IMPLANT
GLOVE SURG SYN 7.0 PF PI (GLOVE) ×2 IMPLANT
GLOVE SURG UNDER LTX SZ7.5 (GLOVE) ×12 IMPLANT
GLOVE SURG UNDER POLY LF SZ7.5 (GLOVE) ×3 IMPLANT
GOWN STRL REUS W/ TWL LRG LVL3 (GOWN DISPOSABLE) ×16 IMPLANT
GOWN STRL REUS W/TWL LRG LVL3 (GOWN DISPOSABLE) ×24
GRASPER SUT TROCAR 14GX15 (MISCELLANEOUS) ×3 IMPLANT
HEMOSTAT ARISTA ABSORB 3G PWDR (HEMOSTASIS) IMPLANT
IRRIGATION STRYKERFLOW (MISCELLANEOUS) IMPLANT
IRRIGATOR STRYKERFLOW (MISCELLANEOUS)
IV NS 1000ML (IV SOLUTION)
IV NS 1000ML BAXH (IV SOLUTION) IMPLANT
IV NS IRRIG 3000ML ARTHROMATIC (IV SOLUTION) ×3 IMPLANT
KIT PINK PAD W/HEAD ARE REST (MISCELLANEOUS) ×3
KIT PINK PAD W/HEAD ARM REST (MISCELLANEOUS) ×2 IMPLANT
KIT PROCEDURE FLUENT (KITS) ×3 IMPLANT
KIT TURNOVER CYSTO (KITS) ×3 IMPLANT
LABEL OR SOLS (LABEL) ×3 IMPLANT
MANIFOLD NEPTUNE II (INSTRUMENTS) ×3 IMPLANT
MANIPULATOR VCARE LG CRV RETR (MISCELLANEOUS) IMPLANT
MANIPULATOR VCARE SML CRV RETR (MISCELLANEOUS) IMPLANT
MANIPULATOR VCARE STD CRV RETR (MISCELLANEOUS) IMPLANT
NS IRRIG 1000ML POUR BTL (IV SOLUTION) ×3 IMPLANT
OBTURATOR OPTICAL STANDARD 8MM (TROCAR) ×1
OBTURATOR OPTICAL STND 8 DVNC (TROCAR) ×2
OBTURATOR OPTICALSTD 8 DVNC (TROCAR) ×2 IMPLANT
OCCLUDER COLPOPNEUMO (BALLOONS) ×3 IMPLANT
PACK DNC HYST (MISCELLANEOUS) ×3 IMPLANT
PACK GYN LAPAROSCOPIC (MISCELLANEOUS) ×3 IMPLANT
PAD DRESSING TELFA 3X8 NADH (GAUZE/BANDAGES/DRESSINGS) IMPLANT
PAD OB MATERNITY 4.3X12.25 (PERSONAL CARE ITEMS) ×3 IMPLANT
PAD PREP 24X41 OB/GYN DISP (PERSONAL CARE ITEMS) ×3 IMPLANT
SCISSORS METZENBAUM CVD 33 (INSTRUMENTS) IMPLANT
SCRUB CHG 4% DYNA-HEX 4OZ (MISCELLANEOUS) ×2 IMPLANT
SEAL CANN UNIV 5-8 DVNC XI (MISCELLANEOUS) ×6 IMPLANT
SEAL ROD LENS SCOPE MYOSURE (ABLATOR) ×3 IMPLANT
SEAL XI 5MM-8MM UNIVERSAL (MISCELLANEOUS) ×3
SEALER VESSEL DA VINCI XI (MISCELLANEOUS) ×1
SEALER VESSEL EXT DVNC XI (MISCELLANEOUS) ×2 IMPLANT
SET CYSTO W/LG BORE CLAMP LF (SET/KITS/TRAYS/PACK) ×1 IMPLANT
SET TUBE FILTERED XL AIRSEAL (SET/KITS/TRAYS/PACK) ×3 IMPLANT
SOLUTION ELECTROLUBE (MISCELLANEOUS) ×3 IMPLANT
STRIP CLOSURE SKIN 1/2X4 (GAUZE/BANDAGES/DRESSINGS) ×1 IMPLANT
SURGILUBE 2OZ TUBE FLIPTOP (MISCELLANEOUS) ×3 IMPLANT
SUT MNCRL 4-0 (SUTURE) ×3
SUT MNCRL 4-0 27XMFL (SUTURE) ×2
SUT VIC AB 0 CT2 27 (SUTURE) ×6 IMPLANT
SUT VLOC 90 2/L VL 12 GS22 (SUTURE) ×3 IMPLANT
SUTURE MNCRL 4-0 27XMF (SUTURE) ×2 IMPLANT
SYS BAG RETRIEVAL 10MM (BASKET) ×2
SYSTEM BAG RETRIEVAL 10MM (BASKET) IMPLANT
TUBING CONNECTING 10 (TUBING) ×3 IMPLANT
WATER STERILE IRR 500ML POUR (IV SOLUTION) ×3 IMPLANT

## 2022-03-19 NOTE — Anesthesia Postprocedure Evaluation (Signed)
Anesthesia Post Note ? ?Patient: Julia Cooper ? ?Procedure(s) Performed: XI ROBOTIC ASSISTED BILATERAL SALPINGO OOPHORECTOMY WITH PELVIC WASHINGS (Bilateral) ?DILATATION AND CURETTAGE /HYSTEROSCOPY ? ?Patient location during evaluation: PACU ?Anesthesia Type: General ?Level of consciousness: awake and alert ?Pain management: pain level controlled ?Vital Signs Assessment: post-procedure vital signs reviewed and stable ?Respiratory status: spontaneous breathing, nonlabored ventilation, respiratory function stable and patient connected to nasal cannula oxygen ?Cardiovascular status: blood pressure returned to baseline and stable ?Postop Assessment: no apparent nausea or vomiting ?Anesthetic complications: no ? ? ?No notable events documented. ? ? ?Last Vitals:  ?Vitals:  ? 03/19/22 1030 03/19/22 1037  ?BP: 140/90 (!) 157/85  ?Pulse:  80  ?Resp:  16  ?Temp: 36.6 ?C (!) 36.2 ?C  ?SpO2: 95% 96%  ?  ?Last Pain:  ?Vitals:  ? 03/19/22 1037  ?TempSrc: Temporal  ?PainSc: 6   ? ? ?  ?  ?  ?  ?  ?  ? ?Arita Miss ? ? ? ? ?

## 2022-03-19 NOTE — Anesthesia Preprocedure Evaluation (Signed)
Anesthesia Evaluation  ?Patient identified by MRN, date of birth, ID band ?Patient awake ? ? ? ?Reviewed: ?Allergy & Precautions, NPO status , Patient's Chart, lab work & pertinent test results, Unable to perform ROS - Chart review only ? ?History of Anesthesia Complications ?(+) PONV and history of anesthetic complications ? ?Airway ?Mallampati: II ? ? ? ? ? ? Dental ? ?(+) Teeth Intact ?  ?Pulmonary ?neg sleep apnea, neg COPD, Not current smoker,  ?  ?breath sounds clear to auscultation ? ? ? ? ? ? Cardiovascular ?hypertension, Pt. on medications ?(-) Past MI and (-) CHF (-) dysrhythmias (-) Valvular Problems/Murmurs ?Rhythm:Regular Rate:Normal ?- Systolic murmurs ? ?  ?Neuro/Psych ?neg Seizures PSYCHIATRIC DISORDERS Anxiety Depression   ? GI/Hepatic ?Neg liver ROS, GERD  Medicated and Controlled,  ?Endo/Other  ?diabetes, Type 2, Insulin Dependent ? Renal/GU ?  ? ?  ?Musculoskeletal ? ?(+) Arthritis ,  ? Abdominal ?  ?Peds ? Hematology ?  ?Anesthesia Other Findings ?Past Medical History: ?No date: Anxiety ?No date: Arthritis ?No date: Cancer [facility]) ?    Comment:  SKIN CANCER-MOHS ?No date: Diabetes mellitus without complication ([place]) ?No date: Family history of adverse reaction to anesthesia ?    Comment:  father  N/V ?No date: GERD (gastroesophageal reflux disease) ?    Comment:  OCC ?No date: Hypertension ?No date: PONV (postoperative nausea and vomiting) ?    Comment:  HARD TO WAKE UP ?No date: Tongue burning sensation ? ? Reproductive/Obstetrics ? ?  ? ? ? ? ? ? ? ? ? ? ? ? ? ?  ?  ? ? ? ? ? ? ? ? ?Anesthesia Physical ? ?Anesthesia Plan ? ?ASA: 3 ? ?Anesthesia Plan: General  ? ?Post-op Pain Management: Ofirmev IV (intra-op)* and Toradol IV (intra-op)*  ? ?Induction: Intravenous ? ?PONV Risk Score and Plan: 4 or greater and Propofol infusion, TIVA, Ondansetron, Aprepitant, Dexamethasone and Midazolam ? ?Airway Management Planned: Oral ETT ? ?Additional Equipment:  None ? ?Intra-op Plan:  ? ?Post-operative Plan:  ? ?Informed Consent: I have reviewed the patients History and Physical, chart, labs and discussed the procedure including the risks, benefits and alternatives for the proposed anesthesia with the patient or authorized representative who has indicated his/her understanding and acceptance.  ? ? ? ?Dental advisory given ? ?Plan Discussed with: CRNA and Surgeon ? ?Anesthesia Plan Comments: (Discussed risks of anesthesia with patient, including possibility of difficulty with spontaneous ventilation under anesthesia necessitating airway intervention, PONV, and rare risks such as cardiac or respiratory or neurological events, and allergic reactions. Discussed the role of CRNA in patient's perioperative care. Patient understands.)  ? ? ? ? ? ? ?Anesthesia Quick Evaluation ? ?

## 2022-03-19 NOTE — Anesthesia Procedure Notes (Signed)
Procedure Name: Intubation ?Date/Time: 03/19/2022 7:41 AM ?Performed by: Loletha Grayer, CRNA ?Pre-anesthesia Checklist: Patient identified, Patient being monitored, Timeout performed, Emergency Drugs available and Suction available ?Patient Re-evaluated:Patient Re-evaluated prior to induction ?Oxygen Delivery Method: Circle system utilized ?Preoxygenation: Pre-oxygenation with 100% oxygen ?Induction Type: IV induction ?Ventilation: Mask ventilation without difficulty and Oral airway inserted - appropriate to patient size ?Laryngoscope Size: 3 and McGraph ?Grade View: Grade I ?Tube type: Oral ?Tube size: 7.0 mm ?Number of attempts: 1 ?Airway Equipment and Method: Stylet ?Placement Confirmation: ETT inserted through vocal cords under direct vision, positive ETCO2 and breath sounds checked- equal and bilateral ?Secured at: 21 cm ?Tube secured with: Tape ?Dental Injury: Teeth and Oropharynx as per pre-operative assessment  ? ? ? ? ?

## 2022-03-19 NOTE — Discharge Instructions (Addendum)
Laparoscopic Ovarian Surgery Discharge Instructions ? ?For the next three days, take ibuprofen and acetaminophen on a schedule, every 8 hours. You can take them together or you can intersperse them, and take one every four hours. Here is a helpful article from the website [employer], regarding constipation ? ?Here are reasons why constipation occurs after surgery: ?1) During the operation and in the recovery room, most people are given opioid pain medication, primarily through an IV, to treat moderate or severe pain. Intravenous opioids include morphine, Dilaudid and fentanyl. After surgery, patients are often prescribed opioid pain medication to take by mouth at home, including codeine, Vicodin?, Norco?, and Percocet?. All of these medications cause constipation by slowing down the movement of your intestine. ?2) Changes in your diet before surgery can be another culprit. It is common to get specific instructions to change how you normally eat or drink before your surgery, like only having liquids the day before or not having anything to eat or drink after midnight the night before surgery. For this reason, temporary dehydration may occur. This, along with not eating or only having liquids, means that you are getting less fiber than usual. Both these factors contribute to constipation. ?3) Changes in your diet after surgery can also contribute to the problem. Although many people don?t have dietary restrictions after operations, being under anesthesia can make you lose your appetite for several hours and maybe even days. Some people can even have nausea or vomiting. Not eating or drinking normally means that you are not getting enough fiber and you can get dehydrated, both leading to constipation. ?4) Lying in a bed more than usual--which happens before, during and after surgery--combined with the medications and diet changes, all work together to slow down your colon and make your poop turn to rock. ? ?No one  likes to be constipated.  ?Let?s face it, it?s not a pleasant feeling when you don?t poop for days, then strain on the toilet to finally pass something large enough to cause damage. An ounce of prevention is worth a pound of cure, so: ?Assume you will be constipated. ?Plan and prepare accordingly. ?Post-surgery is one of those unique situations where the temporary use of laxatives can make a world of difference. Always consult with your doctor, and recognize that if you wait several days after surgery to take a laxative, the constipation might be too severe for these over-the-counter options. It is always important to discuss all medications you plan on taking with your doctor. Ask your doctor if you can start the laxative immediately after surgery. * ? ?Here are go-to post-surgery laxatives: ?Senna: Senna is an herb that acts as a ?stimulant laxative,? meaning it increases the activity of the intestine to cause you to have a bowel movement. It comes in many forms, but senna pills are easy to take and are sold over the counter at almost all pharmacies. Since opioid pain medications slow down the activity of the intestine, it makes sense to take a medication to help reverse that side effect. Long-term use of a stimulant laxative is not a good idea since it can make your colon ?lazy? and not function properly; however, temporary use immediately after surgery is acceptable. In general, if you are able to eat a normal diet, taking senna soon after surgery works the best. Senna usually works within hours to produce a bowel movement, but this is less predictable when you are taking different medications after surgery. Try not to wait several days to  start taking senna, as often it is too late by then. Just like with all medications or supplements, check with your doctor before starting new treatment.  ? ?Magnesium: Magnesium is an important mineral that our body needs. We get magnesium from some foods that we eat,  especially foods that are high in fiber such as broccoli, almonds and whole grains. There are also magnesium-based medications used to treat constipation including milk of magnesia (magnesium hydroxide), magnesium citrate and magnesium oxide. They work by drawing water into the intestine, putting it into the class of ?osmotic? laxatives. Magnesium products in low doses appear to be safe, but if taken in very large doses, can lead to problems such as irregular heartbeat, low blood pressure and even death. It can also affect other medications you might be taking, therefore it is important to discuss using magnesium with your physician and pharmacist before initiating therapy. Most over-the-counter magnesium laxatives work very well to help with the constipation related to surgery, but sometimes they work too well and lead to diarrhea. Make sure you are somewhere with easy access to a bathroom, just in case.  ? ?Bisacodyl: Bisacodyl (generic name) is sold under brand names such as Dulcolax?. Much like senna, it is a ?stimulant laxative,? meaning it makes your intestines move more quickly to push out the stool. This is another good choice to start taking as soon as your doctor says you can take a laxative after surgery. It comes in pill form and as a suppository, which is a good choice for people who cannot or are not allowed to swallow pills. Studies have shown that it works as a laxative, but like most of these medications, you should use this on a short-term basis only. ?  ?Enema: Enemas strike fear in many people, but FEAR NOT! It?s nowhere near as big a deal as you may think. An enema is just a way to get some liquid into your rectum by placing a specially designed device through your anus. If you have never done one, it might seem like a painful, unpleasant, uncomfortable, complicated and lengthy procedure. But in reality, it?s simple, takes just a few seconds and is highly effective. The small ready-made bottles  you buy at the pharmacy are much easier than the hose/large rubber container type. Those recommended positions illustrated in some instructions are generally not necessary to place the enema. It?s very similar to the insertion of a tampon, requiring a slight squat. Some extra lubrication on the enema?s tip (or on your anus) will make it a breeze. In certain cases, there is no substitute for a good enema. For example, if someone has not pooped for a few days, the beginning of the poop waiting to come out can become rock hard. Passing that hard stool can lead to much pain and problems like anal fissures. Inserting a little liquid to break up the rock-hard stool will help make its passage much easier. Enemas come with different liquids. Most come with saline, but there are also mineral oil options. You can also use warm water in the reusable enema containers. They all work. But since saline can sometimes be irritating, so try a mineral oil or water enema instead. ? ?Here are commonly recommended constipation medications that do not work well for post-surgery constipation: ?Docusate: Docusate (generic name) most commonly referred to as Colace? (brand name) is not really a laxative, but is classified as a stool softener. Although this medication is commonly prescribed, it is not recommended for  several reasons: 1) there is no good medical evidence that it works 2) even if it has an effect, which is very questionable, it is minimal and cannot combat the intestinal slowing caused by the opioid medications. Skip docusate to save money and space in your pillbox for something more effective.  ?PEG: Miralax? (brand name) is basically a chemical called polyethylene glycol (PEG) and it has gained tremendous popularity as a laxative. This product is an ?osmotic laxative? meaning it works by pulling water into the stool, making it softer. This is very similar to the action of natural fiber in foods and supplements. Therefore, the  effect seen by this medication is not immediate, causing a bowel movement in a day or more. Is this medication strong enough to battle the constipation related to having an operation? Maybe for some people not

## 2022-03-19 NOTE — Op Note (Signed)
[place] ?PROCEDURE DATE: 03/19/2022 ? ?PREOPERATIVE DIAGNOSIS: Complex adnexal mass; fluid in endometrium postmenopausal ?POSTOPERATIVE DIAGNOSIS: The same ?PROCEDURE:  ?XI ROBOTIC ASSISTED BILATERAL SALPINGO OOPHORECTOMY WITH PELVIC WASHINGS: 81856 (CPT?) ?DILATATION AND CURETTAGE /HYSTEROSCOPY: 31497 (CPT?) - fractional ? ?SURGEON:  Dr. Benjaman Kindler, MD ?ASSISTANT: CST ?Anesthesiologist:  Anesthesiologist: Arita Miss, MD ?CRNA: Lia Foyer, CRNA; Loletha Grayer, CRNA ? ?INDICATIONS: 71 y.o. F here for definitive surgical management secondary to the indications listed under preoperative diagnoses; please see preoperative note for further details.   ? ?Complex adnexal masses and fluid in the endometrium ? ?Risks of surgery were discussed with the patient including but not limited to: bleeding which may require transfusion or reoperation; infection which may require antibiotics; injury to bowel, bladder, ureters or other surrounding organs; need for additional procedures; thromboembolic phenomenon, incisional problems and other postoperative/anesthesia complications. Written informed consent was obtained.   ? ?FINDINGS:   ?Pelvic: External genitalia negative for lesions. Vagina negative. Adnexa negative for masses or nodularity. Cervix without gross lesions. Uterus mobile, anteverted, small.  ? ?Intraoperative findings revealed a normal upper abdomen including bowel, diaphragmatic surfaces, stomach, and omentum.  ?The uterus was small and mobile.  ? ?The right appeared normal.  ?The left ovary was multi-lobular, with a dark cyst and several firm nodular areas. ? ?Bilateral tubes with clear blebs on both ? ?Endometrium with injected atrophic tissue. Visualized well without mass lesions. ? ? ?ANESTHESIA:    General ?INTRAVENOUS FLUIDS:900  ml ?ESTIMATED BLOOD LOSS:10 ml ?URINE OUTPUT: 700 ml ? ?SPECIMENS: Bilateral fallopian tubes and bilateral ovaries; endocervical curettings, endometrial  curettings  ? ?COMPLICATIONS: None immediate ? ?PROCEDURE IN DETAIL: After informed consent was obtained, the patient was taken to the operating room where general anesthesia was obtained without difficulty. The patient was positioned in the dorsal lithotomy position in [place] and her arms were carefully tucked at her sides and the usual precautions were taken. Deep Trendelenburg (20-25 deg) was established to confirm that she does not shift on the table. ? ?She was prepped and draped in normal sterile fashion.  Time-out was performed and a Foley catheter was placed into the bladder. A uterine sound was steri-striped to the single toothed tenaculum at the cervix to use as uterine manipulation. ? ?After infiltration of local anesthetic at the proposed trocar sites, an 8 mm incision was created infraumbilically, and a robotic 89m port was placed under direct visualization. Pneumoperitoneum was created to a pressure of 11 mm Hg. The camera was placed and the abdomin surveyed, noting intact bowel below the site of entry. A survey of the pelvis and upper abdomen revealed the above findings. Right lateral 8-mm robotic port was placed under direct visualization and a 139mleft port similarly placed. ? ?The patient was placed in deepTrendelenburg and the bowel was displaced up into the upper abdomen. The robot was left side docked. The instruments were placed under direct visualization.  ? ?Pelvic washings taken. ? ?The ureters were identified bilaterally coursing outside of the operative field. Round ligaments were divided on each side with the Vessel sealer and the retroperitoneal space was opened bilaterally. ? ?Ovariolysis was performed and the left ovary was dissected medially with care to avoid the ureter.  The infundibulopelvic ligaments were skeletonized, sealed and divided. Attention turned to the right side, and the same procedure performed. ? ?Hemostasis was secured with suction-irrigation.The  intraperitoneal pressure was dropped, and all planes of dissection, vascular pedicles  were found to be hemostatic.  The  39m port fascia was closed with a figure of eight 2-0 vicryl. ? ?The robot was undocked. The lateral trocars were removed under visualization.  The CO2 gas was released and several deep breaths given to remove any remaining CO2 from the peritoneal cavity.  The skin incisions were closed with 4-0 Monocryl subcuticular stitch and dermabond. ? ? ?Fractional D&C, hysteroscopy: ? ?Attention was turned to the vagina.  ? ?Her cervix was serially dilated to 15 [place] dil[place]. An ECC was performed. Her uterus was sounded to 8 cm. The hysteroscope was introduced to reveal the above findings. ? ?A sharp curettage was then performed until there was a gritty texture in all four quadrants.  The tenaculum was removed from the anterior lip of the cervix and the vaginal speculum was removed after assuring hemostasis.  ? ?The patient tolerated the procedure well and was taken to the recovery area awake and in stable condition. She received iv acetaminophen and Toradol prior to leaving the OR. ? ?The patient will be discharged to home as per PACU criteria. Routine postoperative instructions given.  She was prescribed Ibuprofen and Colace.  She will follow up in the clinic in two weeks for postoperative evaluation. ? ? Anesthesia was reversed without difficulty. ? ?The patient tolerated the procedure well.  Sponge, lap and needle counts were correct x2.  The patient was taken to recovery room in excellent condition. ?  ?

## 2022-03-19 NOTE — Interval H&P Note (Signed)
History and Physical Interval Note: ? ?03/19/2022 ?7:20 AM ? ?Julia Cooper  has presented today for surgery, with the diagnosis of complex adnexal masses, FLUID FILLED ENDOMETRIAL CAVITY.  The various methods of treatment have been discussed with the patient and family. After consideration of risks, benefits and other options for treatment, the patient has consented to  Procedure(s): ?XI ROBOTIC ASSISTED BILATERAL SALPINGO OOPHORECTOMY WITH PELVIC WASHINGS (Bilateral) ?DILATATION AND CURETTAGE /HYSTEROSCOPY (N/A) as a surgical intervention.  The patient's history has been reviewed, patient examined, no change in status, stable for surgery.  I have reviewed the patient's chart and labs.  Questions were answered to the patient's satisfaction.   ? ? ?Benjaman Kindler ? ? ?

## 2022-03-19 NOTE — Transfer of Care (Signed)
Immediate Anesthesia Transfer of Care Note ? ?Patient: Julia Cooper ? ?Procedure(s) Performed: XI ROBOTIC ASSISTED BILATERAL SALPINGO OOPHORECTOMY WITH PELVIC WASHINGS (Bilateral) ?DILATATION AND CURETTAGE /HYSTEROSCOPY ? ?Patient Location: PACU ? ?Anesthesia Type:General ? ?Level of Consciousness: awake and patient cooperative ? ?Airway & Oxygen Therapy: Patient Spontanous Breathing and Patient connected to face mask oxygen ? ?Post-op Assessment: Report given to RN and Post -op Vital signs reviewed and stable ? ?Post vital signs: Reviewed and stable ? ?Last Vitals:  ?Vitals Value Taken Time  ?BP 138/67 03/19/22 0941  ?Temp 36.6 ?C 03/19/22 0941  ?Pulse 79 03/19/22 0944  ?Resp 20 03/19/22 0944  ?SpO2 98 % 03/19/22 0944  ? ? ?Last Pain:  ?Vitals:  ? 03/19/22 0941  ?TempSrc:   ?PainSc: Asleep  ?   ? ?  ? ?Complications: No notable events documented. ?

## 2022-03-20 ENCOUNTER — Encounter: Payer: Self-pay | Admitting: Obstetrics and Gynecology

## 2022-03-20 LAB — CYTOLOGY - NON PAP

## 2022-03-20 LAB — SURGICAL PATHOLOGY

## 2022-09-12 ENCOUNTER — Other Ambulatory Visit: Payer: Self-pay | Admitting: Obstetrics and Gynecology

## 2022-09-12 DIAGNOSIS — Z1231 Encounter for screening mammogram for malignant neoplasm of breast: Secondary | ICD-10-CM

## 2022-10-08 ENCOUNTER — Ambulatory Visit
Admission: RE | Admit: 2022-10-08 | Discharge: 2022-10-08 | Disposition: A | Discharge status: Home / Self Care | Payer: Medicare Other | Source: Ambulatory Visit | Attending: Obstetrics and Gynecology | Admitting: Obstetrics and Gynecology

## 2022-10-08 DIAGNOSIS — Z1231 Encounter for screening mammogram for malignant neoplasm of breast: Secondary | ICD-10-CM | POA: Diagnosis present

## 2022-10-31 ENCOUNTER — Other Ambulatory Visit: Payer: Self-pay | Admitting: Family Medicine

## 2022-10-31 DIAGNOSIS — Z9189 Other specified personal risk factors, not elsewhere classified: Secondary | ICD-10-CM

## 2022-11-15 ENCOUNTER — Other Ambulatory Visit: Payer: Medicare Other

## 2022-11-15 ENCOUNTER — Ambulatory Visit
Admission: RE | Admit: 2022-11-15 | Discharge: 2022-11-15 | Disposition: A | Discharge status: Home / Self Care | Payer: No Typology Code available for payment source | Source: Ambulatory Visit | Attending: Family Medicine | Admitting: Family Medicine

## 2022-11-15 DIAGNOSIS — Z9189 Other specified personal risk factors, not elsewhere classified: Secondary | ICD-10-CM

## 2023-01-15 ENCOUNTER — Ambulatory Visit: Payer: Medicare Other | Admitting: Cardiovascular Disease

## 2023-01-20 NOTE — Progress Notes (Unsigned)
Cardiology Office Note  Date:  01/21/2023   ID:  Julia Cooper, DOB February 05, 1951, MRN [id number]  PCP:  Dion Body, MD   Chief Complaint  Patient presents with   New Patient (Initial Visit)    Ref by Dr. Netty Starring for abnormal CT calcium score. "Doing well."  Medications reviewed by the patient verbally.     HPI:  [name] is a 72 year old woman with past medical history of Diabetes type 2 Hyperlipidemia Essential hypertension Family history heart disease Who presents by referral from Dr. Netty Starring for abnormal CT scan of heart  Recently seen by primary care Risk stratification screening with calcium scoring ordered Calcium scoring November 15, 2022 score of 168 CT scan images pulled up and reviewed Mild coronary calcification noted in the left circumflex and RCA No significant aortic atherosclerosis noted  She denies any chest pain or shortness of breath concerning for angina  Lab work reviewed Total cholesterol 139 LDL 52 A1c 7.7  EKG personally reviewed by myself on todays visit Normal sinus rhythm rate 85 bpm no significant ST-T wave changes   PMH:   has a past medical history of Adnexal mass, Anxiety, Arthritis, Cancer ([place]), Cervical spinal stenosis, Depression, Diabetes mellitus without complication ([place]), Family history of adverse reaction to anesthesia, GERD (gastroesophageal reflux disease), Hypertension, PONV (postoperative nausea and vomiting), and Tongue burning sensation.  PSH:    Past Surgical History:  Procedure Laterality Date   BREAST BIOPSY Left 2014   core bx, BENIGN BREAST TISSUE WITH FOCAL SCLEROSING ADENOSIS AND PARTIAL FRAGMENT OF BENIGN CYSTIC CHANGE   BREAST BIOPSY Left 07/09/2016   papilloma   BREAST BIOPSY Left 10/01/2018   INTRADUCTAL PAPILLOMA    BREAST EXCISIONAL BIOPSY Left 1993   neg   BREAST EXCISIONAL BIOPSY Left 10/22/2018   path pending   BREAST LUMPECTOMY Left 07/20/2016   papilloma excised   BREAST  LUMPECTOMY Left 10/2018   papilloma   BREAST LUMPECTOMY WITH NEEDLE LOCALIZATION Left 07/20/2016   Procedure: BREAST LUMPECTOMY WITH NEEDLE LOCALIZATION;  Surgeon: Leonie Green, MD;  Location: [facility];  Service: General;  Laterality: Left;   BREAST LUMPECTOMY WITH NEEDLE LOCALIZATION Left 10/22/2018   Procedure: BREAST BIOPSY  WITH NEEDLE LOCALIZATION;  Surgeon: Herbert Pun, MD;  Location: [facility];  Service: General;  Laterality: Left;   CHOLECYSTECTOMY  2012   COLONOSCOPY  2012   COLONOSCOPY WITH PROPOFOL N/A 10/24/2021   Procedure: COLONOSCOPY WITH PROPOFOL;  Surgeon: Lesly Rubenstein, MD;  Location: [facility];  Service: Endoscopy;  Laterality: N/A;  DM   DILATION AND CURETTAGE OF UTERUS     HYSTEROSCOPY WITH D & C N/A 03/19/2022   Procedure: DILATATION AND CURETTAGE /HYSTEROSCOPY;  Surgeon: Benjaman Kindler, MD;  Location: [facility];  Service: Gynecology;  Laterality: N/A;   JOINT REPLACEMENT     KNEE ARTHROPLASTY Left 07/27/2020   Procedure: COMPUTER ASSISTED TOTAL KNEE ARTHROPLASTY;  Surgeon: Dereck Leep, MD;  Location: [facility];  Service: Orthopedics;  Laterality: Left;   KNEE ARTHROSCOPY Left 06/10/2019   Procedure: ARTHROSCOPY KNEE LEFT MEDIAL MENSIECTOMY, CHONDROPLASTY;  Surgeon: Dereck Leep, MD;  Location: [facility];  Service: Orthopedics;  Laterality: Left;   NECK SURGERY  2002   titanium plate screws and donor bone   NOSE SURGERY  1973   ROBOTIC ASSISTED BILATERAL SALPINGO OOPHERECTOMY Bilateral 03/19/2022   Procedure: XI ROBOTIC ASSISTED BILATERAL SALPINGECTOMY WITH PELVIC WASHINGS;  Surgeon: Benjaman Kindler, MD;  Location: [facility];  Service: Gynecology;  Laterality: Bilateral;  TONSILLECTOMY     age 72    Current Outpatient Medications  Medication Sig Dispense Refill   acetaminophen (TYLENOL) 500 MG tablet Take 1,000 mg by mouth every 8 (eight) hours as needed for moderate pain.     aspirin EC 81 MG tablet Take 81 mg by mouth daily.      calcium carbonate (TUMS - DOSED IN MG ELEMENTAL CALCIUM) 500 MG chewable tablet Chew 1,000 mg by mouth daily as needed for indigestion or heartburn.      Cholecalciferol (VITAMIN D3) 2000 units TABS Take 2,000 Units by mouth daily after lunch.      escitalopram (LEXAPRO) 10 MG tablet Take 10 mg by mouth daily at 8 pm.      hydrALAZINE (APRESOLINE) 50 MG tablet Take 50 mg by mouth 2 (two) times a day.     LANTUS SOLOSTAR 100 UNIT/ML Solostar Pen Inject 44 Units into the skin at bedtime.     losartan-hydrochlorothiazide (HYZAAR) 100-25 MG tablet Take 1 tablet by mouth every morning.      magnesium oxide (MAG-OX) 400 MG tablet Take 400 mg by mouth daily after lunch.      metformin (FORTAMET) 1000 MG (OSM) 24 hr tablet Take 1,000 mg by mouth 2 (two) times daily with a meal.   3   Multiple Vitamins-Minerals (MULTIVITAMIN WITH MINERALS) tablet Take 1 tablet by mouth daily.      potassium chloride SA (K-DUR,KLOR-CON) 20 MEQ tablet Take 20 mEq by mouth at bedtime.      promethazine-dextromethorphan (PROMETHAZINE-DM) 6.25-15 MG/5ML syrup Take 5 mLs by mouth every 6 (six) hours as needed.     rosuvastatin (CRESTOR) 5 MG tablet Take 5 mg by mouth daily.     Semaglutide, 1 MG/DOSE, 2 MG/1.5ML SOPN Inject 2 mg into the skin once a week. Sunday     docusate sodium (COLACE) 100 MG capsule Take 1 capsule (100 mg total) by mouth 2 (two) times daily. To keep stools soft (Patient not taking: Reported on 01/21/2023) 30 capsule 0   oxyCODONE (OXY IR/ROXICODONE) 5 MG immediate release tablet Take 1 tablet (5 mg total) by mouth every 4 (four) hours as needed for severe pain. (Patient not taking: Reported on 01/21/2023) 8 tablet 0   No current facility-administered medications for this visit.     Allergies:   Ace inhibitors, Ciprofloxacin, Doxazosin, Other, and Amlodipine   Social History:  The patient  reports that she has never smoked. She has never used smokeless tobacco. She reports that she does not drink alcohol and  does not use drugs.   Family History:   family history is not on file.    Review of Systems: Review of Systems  Constitutional: Negative.   HENT: Negative.    Respiratory: Negative.    Cardiovascular: Negative.   Gastrointestinal: Negative.   Musculoskeletal: Negative.   Neurological: Negative.   Psychiatric/Behavioral: Negative.    All other systems reviewed and are negative.    PHYSICAL EXAM: VS:  BP (!) 140/70 (BP Location: Left Arm, Patient Position: Sitting, Cuff Size: Large)   Pulse 85   Ht '5\' 6"'$  (1.676 m)   Wt 218 lb (98.9 kg)   SpO2 97%   BMI 35.19 kg/m  , BMI Body mass index is 35.19 kg/m. GEN: Well nourished, well developed, in no acute distress HEENT: normal Neck: no JVD, carotid bruits, or masses Cardiac: RRR; no murmurs, rubs, or gallops,no edema  Respiratory:  clear to auscultation bilaterally, normal work of breathing GI: soft, nontender,  nondistended, + BS MS: no deformity or atrophy Skin: warm and dry, no rash Neuro:  Strength and sensation are intact Psych: euthymic mood, full affect   Recent Labs: 03/14/2022: BUN 11; Creatinine, Ser 0.59; Hemoglobin 12.4; Platelets 295; Potassium 3.6; Sodium 131    Lipid Panel No results found for: "CHOL", "HDL", "LDLCALC", "TRIG"    Wt Readings from Last 3 Encounters:  01/21/23 218 lb (98.9 kg)  03/19/22 220 lb (99.8 kg)  03/12/22 220 lb (99.8 kg)     ASSESSMENT AND PLAN:  Problem List Items Addressed This Visit   None Visit Diagnoses     Coronary artery calcification seen on CAT scan    -  Primary   Relevant Medications   aspirin EC 81 MG tablet   rosuvastatin (CRESTOR) 5 MG tablet   Controlled type 2 diabetes mellitus with other circulatory complication, without long-term current use of insulin (HCC)       Relevant Medications   aspirin EC 81 MG tablet   LANTUS SOLOSTAR 100 UNIT/ML Solostar Pen   rosuvastatin (CRESTOR) 5 MG tablet   Mixed hyperlipidemia       Relevant Medications   aspirin EC  81 MG tablet   rosuvastatin (CRESTOR) 5 MG tablet      Coronary artery calcification CT scan images pulled up and reviewed in detail Low calcium score 160s, asymptomatic with no significant symptoms concerning for angina.  Mild calcification noted in the left circumflex and RCA No further workup needed at this time Non-smoker, cholesterol before Crestor was actually very well-controlled Risk factor includes diabetes Stressed importance of strict diet, exercise program Suggest she continue on her Crestor 5 daily Discussed anginal symptoms to watch for  Diabetes type 2 with circulatory complications Followed by endocrinology Stressed importance of low carbohydrate diet, walking program  Hyperlipidemia Tolerating Crestor 5 daily   Total encounter time more than 60 minutes  Greater than 50% was spent in counseling and coordination of care with the patient    Signed, Esmond Plants, M.D., Ph.D. [place], [place]

## 2023-01-21 ENCOUNTER — Encounter: Payer: Self-pay | Admitting: Cardiovascular Disease

## 2023-01-21 ENCOUNTER — Ambulatory Visit: Payer: Medicare Other | Attending: Cardiovascular Disease | Admitting: Cardiovascular Disease

## 2023-01-21 VITALS — BP 140/70 | HR 85 | Ht 66.0 in | Wt 218.0 lb

## 2023-01-21 DIAGNOSIS — E782 Mixed hyperlipidemia: Secondary | ICD-10-CM

## 2023-01-21 DIAGNOSIS — I2510 Atherosclerotic heart disease of native coronary artery without angina pectoris: Secondary | ICD-10-CM

## 2023-01-21 DIAGNOSIS — E1159 Type 2 diabetes mellitus with other circulatory complications: Secondary | ICD-10-CM | POA: Diagnosis not present

## 2023-01-21 NOTE — Patient Instructions (Signed)
Medication Instructions:  No changes  If you need a refill on your cardiac medications before your next appointment, please call your pharmacy.   Lab work: No new labs needed  Testing/Procedures: No new testing needed  Follow-Up: At [facility], you and your health needs are our priority.  As part of our continuing mission to provide you with exceptional heart care, we have created designated Provider Care Teams.  These Care Teams include your primary Cardiologist (physician) and Advanced Practice Providers (APPs -  Physician Assistants and Nurse Practitioners) who all work together to provide you with the care you need, when you need it.  You will need a follow up appointment as needed  Providers on your designated Care Team:   Christopher Berge, NP Ryan Dunn, PA-C Cadence Furth, PA-C  COVID-19 Vaccine Information can be found at: [email or web address] For questions related to vaccine distribution or [email or web address] please email [email or web address] or call [phone]    

## 2023-11-20 ENCOUNTER — Other Ambulatory Visit: Payer: Self-pay

## 2023-11-20 DIAGNOSIS — Z1231 Encounter for screening mammogram for malignant neoplasm of breast: Secondary | ICD-10-CM

## 2024-01-16 ENCOUNTER — Ambulatory Visit
Admission: RE | Admit: 2024-01-16 | Discharge: 2024-01-16 | Disposition: A | Discharge status: Home / Self Care | Payer: Medicare Other | Source: Ambulatory Visit

## 2024-01-16 DIAGNOSIS — Z1231 Encounter for screening mammogram for malignant neoplasm of breast: Secondary | ICD-10-CM | POA: Insufficient documentation
